# Patient Record
Sex: Male | Born: 1950 | Race: White | Hispanic: No | Marital: Married | Smoking: Never smoker
Health system: Southern US, Community
[De-identification: ages and names within clinical notes are randomized; demographics above are authoritative.]

## PROBLEM LIST (undated history)

## (undated) DIAGNOSIS — G4730 Sleep apnea, unspecified: Secondary | ICD-10-CM

## (undated) DIAGNOSIS — R7303 Prediabetes: Secondary | ICD-10-CM

## (undated) DIAGNOSIS — C439 Malignant melanoma of skin, unspecified: Secondary | ICD-10-CM

## (undated) DIAGNOSIS — B159 Hepatitis A without hepatic coma: Secondary | ICD-10-CM

## (undated) DIAGNOSIS — I10 Essential (primary) hypertension: Secondary | ICD-10-CM

## (undated) HISTORY — PX: HERNIA REPAIR: SHX51

## (undated) HISTORY — PX: CYSTOSCOPY W/ URETERAL STENT PLACEMENT: SHX1429

## (undated) HISTORY — PX: MELANOMA EXCISION: SHX5266

---

## 2015-04-18 HISTORY — PX: APPENDECTOMY: SHX54

## 2020-08-02 ENCOUNTER — Observation Stay (HOSPITAL_COMMUNITY): Payer: PRIVATE HEALTH INSURANCE

## 2020-08-02 ENCOUNTER — Emergency Department (HOSPITAL_COMMUNITY): Payer: PRIVATE HEALTH INSURANCE

## 2020-08-02 ENCOUNTER — Inpatient Hospital Stay (HOSPITAL_COMMUNITY)
Admission: EM | Admit: 2020-08-02 | Discharge: 2020-08-14 | DRG: 025 | Disposition: A | Discharge status: Home / Self Care | Payer: PRIVATE HEALTH INSURANCE | Attending: Neurosurgery | Admitting: Neurosurgery

## 2020-08-02 ENCOUNTER — Encounter (HOSPITAL_COMMUNITY): Payer: Self-pay | Admitting: Internal Medicine

## 2020-08-02 ENCOUNTER — Other Ambulatory Visit: Payer: Self-pay

## 2020-08-02 DIAGNOSIS — D496 Neoplasm of unspecified behavior of brain: Secondary | ICD-10-CM

## 2020-08-02 DIAGNOSIS — N1831 Chronic kidney disease, stage 3a: Secondary | ICD-10-CM | POA: Diagnosis present

## 2020-08-02 DIAGNOSIS — C4442 Squamous cell carcinoma of skin of scalp and neck: Secondary | ICD-10-CM | POA: Diagnosis present

## 2020-08-02 DIAGNOSIS — E669 Obesity, unspecified: Secondary | ICD-10-CM | POA: Diagnosis present

## 2020-08-02 DIAGNOSIS — I700 Atherosclerosis of aorta: Secondary | ICD-10-CM | POA: Diagnosis present

## 2020-08-02 DIAGNOSIS — Z6834 Body mass index (BMI) 34.0-34.9, adult: Secondary | ICD-10-CM

## 2020-08-02 DIAGNOSIS — R29810 Facial weakness: Secondary | ICD-10-CM | POA: Diagnosis present

## 2020-08-02 DIAGNOSIS — G9389 Other specified disorders of brain: Secondary | ICD-10-CM | POA: Diagnosis not present

## 2020-08-02 DIAGNOSIS — R4702 Dysphasia: Secondary | ICD-10-CM | POA: Diagnosis present

## 2020-08-02 DIAGNOSIS — B368 Other specified superficial mycoses: Secondary | ICD-10-CM | POA: Diagnosis present

## 2020-08-02 DIAGNOSIS — Z20822 Contact with and (suspected) exposure to covid-19: Secondary | ICD-10-CM | POA: Diagnosis present

## 2020-08-02 DIAGNOSIS — Z91013 Allergy to seafood: Secondary | ICD-10-CM

## 2020-08-02 DIAGNOSIS — G992 Myelopathy in diseases classified elsewhere: Secondary | ICD-10-CM | POA: Diagnosis present

## 2020-08-02 DIAGNOSIS — G936 Cerebral edema: Secondary | ICD-10-CM | POA: Diagnosis present

## 2020-08-02 DIAGNOSIS — G8191 Hemiplegia, unspecified affecting right dominant side: Secondary | ICD-10-CM | POA: Diagnosis present

## 2020-08-02 DIAGNOSIS — R471 Dysarthria and anarthria: Secondary | ICD-10-CM | POA: Diagnosis present

## 2020-08-02 DIAGNOSIS — C61 Malignant neoplasm of prostate: Secondary | ICD-10-CM | POA: Diagnosis present

## 2020-08-02 DIAGNOSIS — C713 Malignant neoplasm of parietal lobe: Principal | ICD-10-CM | POA: Diagnosis present

## 2020-08-02 DIAGNOSIS — R001 Bradycardia, unspecified: Secondary | ICD-10-CM | POA: Diagnosis not present

## 2020-08-02 DIAGNOSIS — L570 Actinic keratosis: Secondary | ICD-10-CM | POA: Diagnosis present

## 2020-08-02 DIAGNOSIS — R32 Unspecified urinary incontinence: Secondary | ICD-10-CM | POA: Diagnosis not present

## 2020-08-02 DIAGNOSIS — R4701 Aphasia: Secondary | ICD-10-CM | POA: Diagnosis not present

## 2020-08-02 DIAGNOSIS — C7931 Secondary malignant neoplasm of brain: Secondary | ICD-10-CM

## 2020-08-02 DIAGNOSIS — N4289 Other specified disorders of prostate: Secondary | ICD-10-CM

## 2020-08-02 DIAGNOSIS — R972 Elevated prostate specific antigen [PSA]: Secondary | ICD-10-CM

## 2020-08-02 DIAGNOSIS — Z880 Allergy status to penicillin: Secondary | ICD-10-CM

## 2020-08-02 DIAGNOSIS — G939 Disorder of brain, unspecified: Secondary | ICD-10-CM

## 2020-08-02 DIAGNOSIS — Z808 Family history of malignant neoplasm of other organs or systems: Secondary | ICD-10-CM

## 2020-08-02 DIAGNOSIS — I129 Hypertensive chronic kidney disease with stage 1 through stage 4 chronic kidney disease, or unspecified chronic kidney disease: Secondary | ICD-10-CM | POA: Diagnosis present

## 2020-08-02 DIAGNOSIS — I611 Nontraumatic intracerebral hemorrhage in hemisphere, cortical: Secondary | ICD-10-CM | POA: Diagnosis present

## 2020-08-02 DIAGNOSIS — M4802 Spinal stenosis, cervical region: Secondary | ICD-10-CM | POA: Diagnosis present

## 2020-08-02 DIAGNOSIS — Z85820 Personal history of malignant melanoma of skin: Secondary | ICD-10-CM

## 2020-08-02 DIAGNOSIS — G4733 Obstructive sleep apnea (adult) (pediatric): Secondary | ICD-10-CM | POA: Diagnosis present

## 2020-08-02 DIAGNOSIS — F40240 Claustrophobia: Secondary | ICD-10-CM | POA: Diagnosis present

## 2020-08-02 DIAGNOSIS — R159 Full incontinence of feces: Secondary | ICD-10-CM | POA: Diagnosis not present

## 2020-08-02 DIAGNOSIS — M48061 Spinal stenosis, lumbar region without neurogenic claudication: Secondary | ICD-10-CM | POA: Diagnosis present

## 2020-08-02 HISTORY — DX: Prediabetes: R73.03

## 2020-08-02 HISTORY — DX: Essential (primary) hypertension: I10

## 2020-08-02 HISTORY — DX: Sleep apnea, unspecified: G47.30

## 2020-08-02 HISTORY — DX: Malignant melanoma of skin, unspecified: C43.9

## 2020-08-02 HISTORY — DX: Hepatitis a without hepatic coma: B15.9

## 2020-08-02 LAB — DIFFERENTIAL
Abs Immature Granulocytes: 0.06 10*3/uL (ref 0.00–0.07)
Basophils Absolute: 0 10*3/uL (ref 0.0–0.1)
Basophils Relative: 0 %
Eosinophils Absolute: 0.2 10*3/uL (ref 0.0–0.5)
Eosinophils Relative: 3 %
Immature Granulocytes: 1 %
Lymphocytes Relative: 22 %
Lymphs Abs: 1.7 10*3/uL (ref 0.7–4.0)
Monocytes Absolute: 0.6 10*3/uL (ref 0.1–1.0)
Monocytes Relative: 8 %
Neutro Abs: 5 10*3/uL (ref 1.7–7.7)
Neutrophils Relative %: 66 %

## 2020-08-02 LAB — CBC
HCT: 48.7 % (ref 39.0–52.0)
Hemoglobin: 16 g/dL (ref 13.0–17.0)
MCH: 30.5 pg (ref 26.0–34.0)
MCHC: 32.9 g/dL (ref 30.0–36.0)
MCV: 92.9 fL (ref 80.0–100.0)
Platelets: 202 10*3/uL (ref 150–400)
RBC: 5.24 MIL/uL (ref 4.22–5.81)
RDW: 12.8 % (ref 11.5–15.5)
WBC: 7.5 10*3/uL (ref 4.0–10.5)
nRBC: 0 % (ref 0.0–0.2)

## 2020-08-02 LAB — COMPREHENSIVE METABOLIC PANEL
ALT: 20 U/L (ref 0–44)
AST: 20 U/L (ref 15–41)
Albumin: 4 g/dL (ref 3.5–5.0)
Alkaline Phosphatase: 34 U/L — ABNORMAL LOW (ref 38–126)
Anion gap: 9 (ref 5–15)
BUN: 22 mg/dL (ref 8–23)
CO2: 24 mmol/L (ref 22–32)
Calcium: 9.4 mg/dL (ref 8.9–10.3)
Chloride: 106 mmol/L (ref 98–111)
Creatinine, Ser: 1.68 mg/dL — ABNORMAL HIGH (ref 0.61–1.24)
GFR, Estimated: 44 mL/min — ABNORMAL LOW (ref >60)
Glucose, Bld: 129 mg/dL — ABNORMAL HIGH (ref 70–99)
Potassium: 3.9 mmol/L (ref 3.5–5.1)
Sodium: 139 mmol/L (ref 135–145)
Total Bilirubin: 0.9 mg/dL (ref 0.3–1.2)
Total Protein: 7 g/dL (ref 6.5–8.1)

## 2020-08-02 LAB — I-STAT CHEM 8, ED
BUN: 26 mg/dL — ABNORMAL HIGH (ref 8–23)
Calcium, Ion: 1.12 mmol/L — ABNORMAL LOW (ref 1.15–1.40)
Chloride: 107 mmol/L (ref 98–111)
Creatinine, Ser: 1.5 mg/dL — ABNORMAL HIGH (ref 0.61–1.24)
Glucose, Bld: 129 mg/dL — ABNORMAL HIGH (ref 70–99)
HCT: 49 % (ref 39.0–52.0)
Hemoglobin: 16.7 g/dL (ref 13.0–17.0)
Potassium: 3.9 mmol/L (ref 3.5–5.1)
Sodium: 142 mmol/L (ref 135–145)
TCO2: 24 mmol/L (ref 22–32)

## 2020-08-02 LAB — CBG MONITORING, ED: Glucose-Capillary: 134 mg/dL — ABNORMAL HIGH (ref 70–99)

## 2020-08-02 LAB — PROTIME-INR
INR: 1 (ref 0.8–1.2)
Prothrombin Time: 12.9 seconds (ref 11.4–15.2)

## 2020-08-02 LAB — RESP PANEL BY RT-PCR (FLU A&B, COVID) ARPGX2
Influenza A by PCR: NEGATIVE
Influenza B by PCR: NEGATIVE
SARS Coronavirus 2 by RT PCR: NEGATIVE

## 2020-08-02 LAB — APTT: aPTT: 25 seconds (ref 24–36)

## 2020-08-02 IMAGING — CT CT CHEST W/ CM
2 of 5 series · 13 of 36 positions shown, 16 images · IV contrast (omnipaque)
Comparison: None.

CLINICAL DATA: CNS neoplasm, staging evaluation

EXAM:
CT CHEST, ABDOMEN, AND PELVIS WITH CONTRAST
TECHNIQUE: Multidetector CT imaging of the chest, abdomen and pelvis was
performed following the standard protocol during bolus
administration of intravenous contrast.
CONTRAST:  70mL OMNIPAQUE IOHEXOL 300 MG/ML  SOLN

[Series 3: cap with 5mm st · axial · 0.98mm/px · z∈[+668,+1253]mm · 10 of 141 slices shown, 13 images]
[im 12/141  mediastinal]
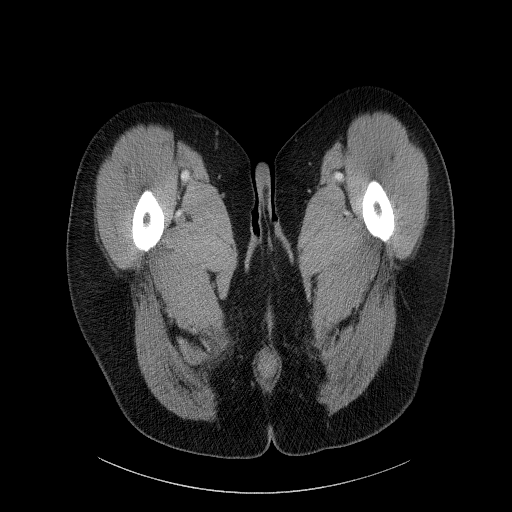
[im 12/141  lung]
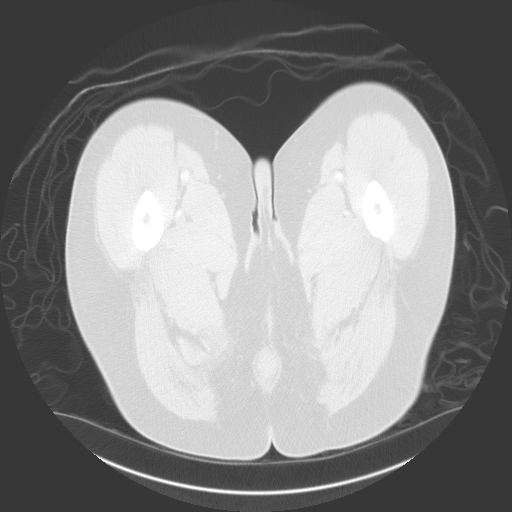
[im 24/141  lung]
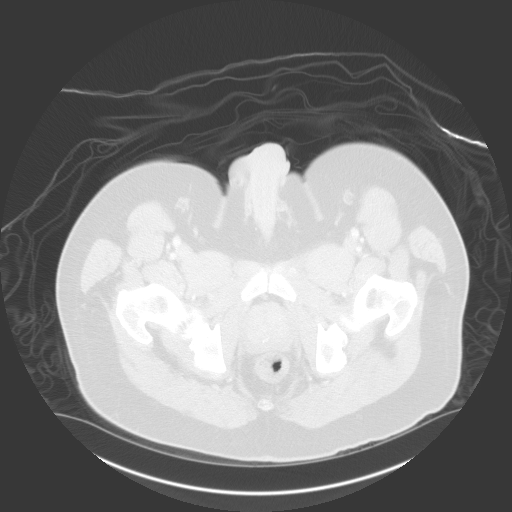
[im 36/141  lung]
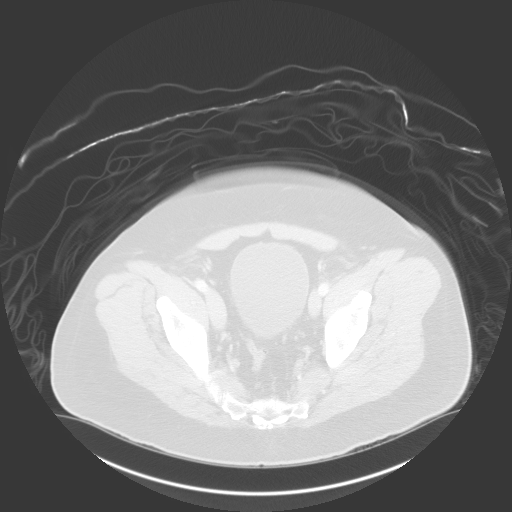
[im 47/141  lung]
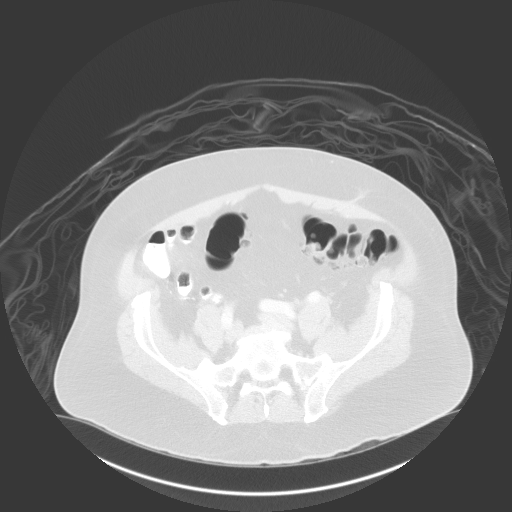
[im 59/141  mediastinal]
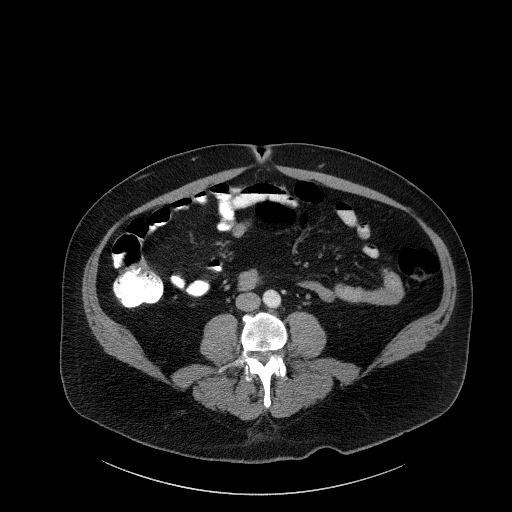
[im 59/141  lung]
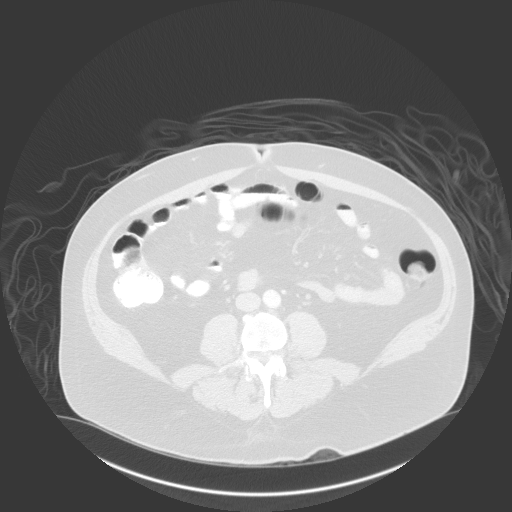
[im 82/141  lung]
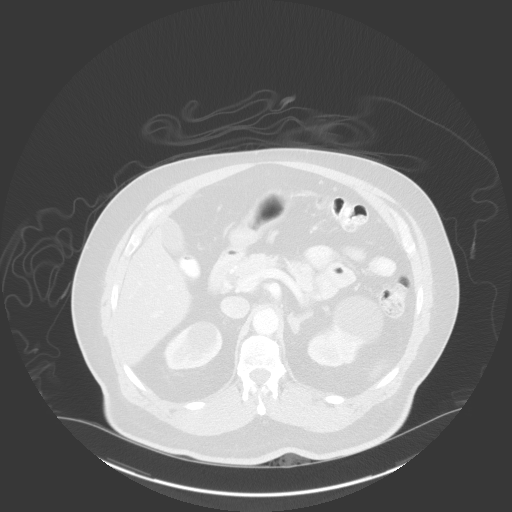
[im 94/141  lung]
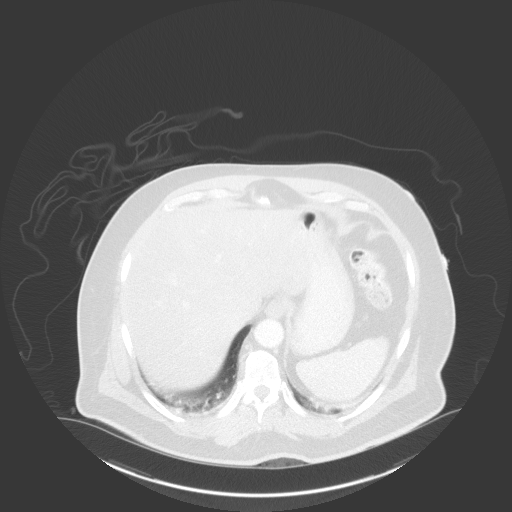
[im 106/141  lung]
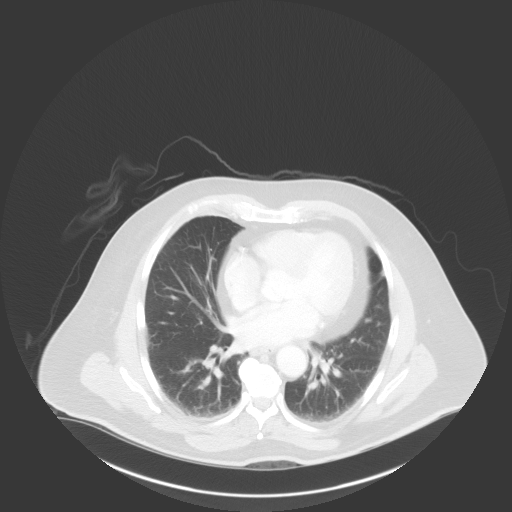
[im 117/141  mediastinal]
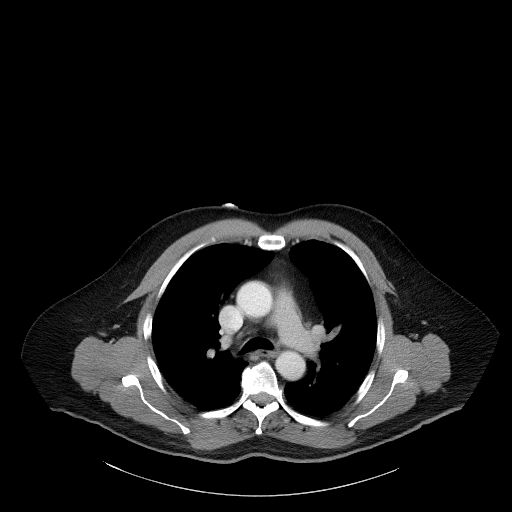
[im 117/141  lung]
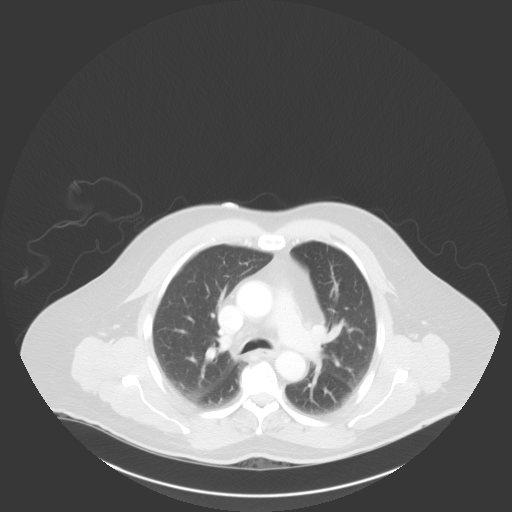
[im 129/141  lung]
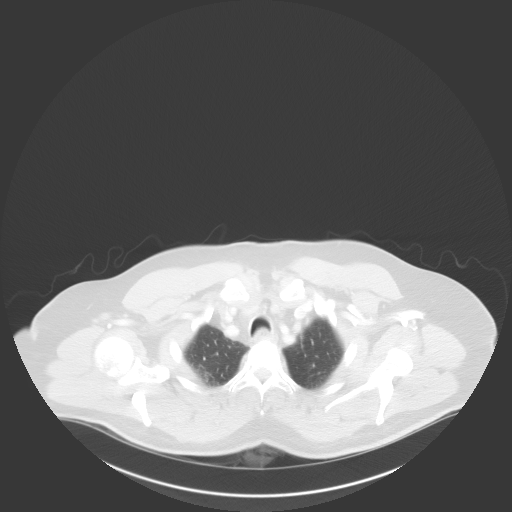

[Series 6: cap with 3mm st cor · coronal · 0.87mm/px · 3 of 174 slices shown]
[im 35/174  lung]
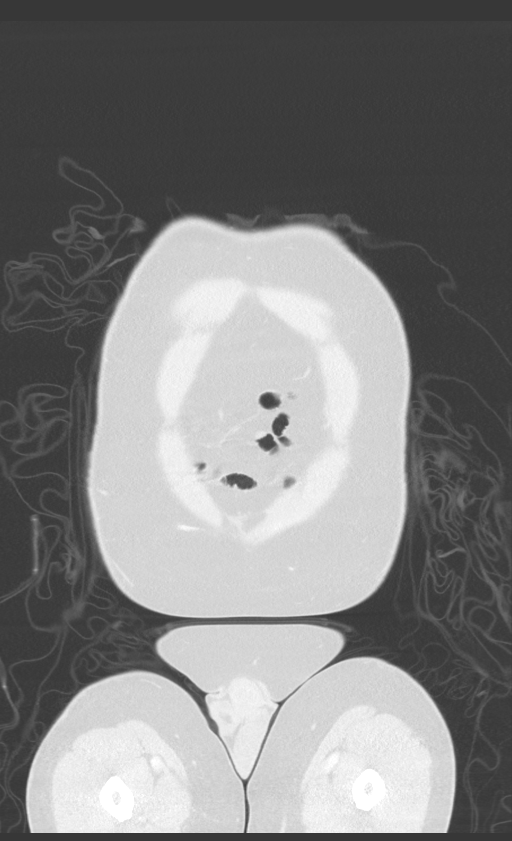
[im 70/174  lung]
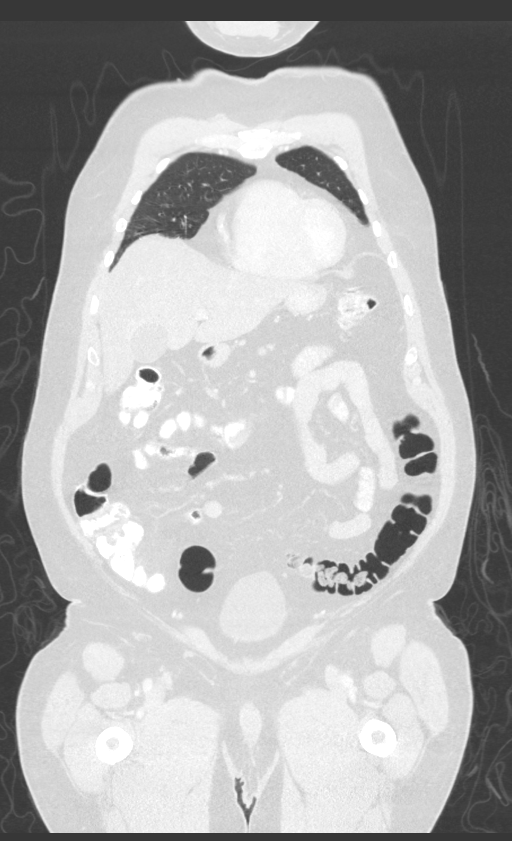
[im 104/174  lung]
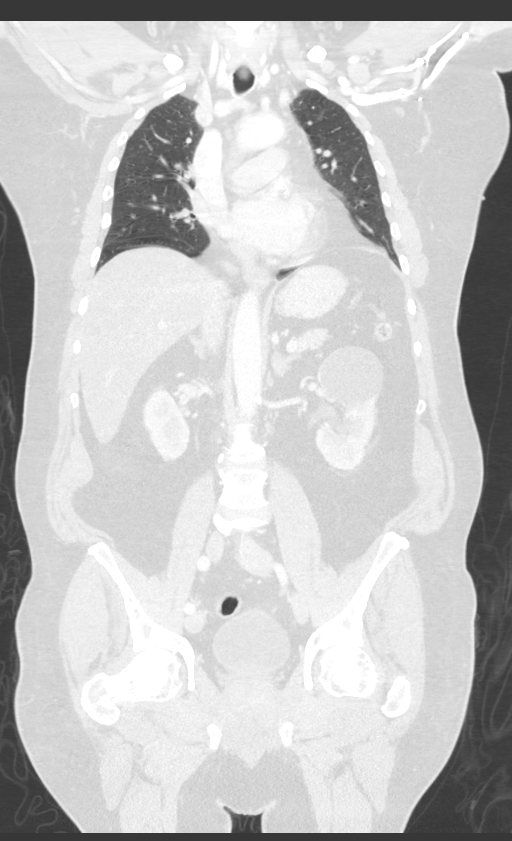

[13 of 36 positions shown; findings below may reference images not displayed]

FINDINGS: CT CHEST FINDINGS

Cardiovascular: The heart is normal in size. No pericardial
effusion.

No evidence of thoracic aortic aneurysm.

Coronary atherosclerosis of the LAD.

Mediastinum/Nodes: No suspicious mediastinal lymphadenopathy.

Visualized thyroid is unremarkable.

Lungs/Pleura: Mild dependent atelectasis in the bilateral upper and
lower lobes.

No focal consolidation.

No suspicious pulmonary nodules.

No pleural effusion or pneumothorax.

Musculoskeletal: Degenerative changes of the thoracic spine.

CT ABDOMEN PELVIS FINDINGS

Hepatobiliary: Liver is notable for hepatic steatosis with focal
fatty sparing along the gallbladder fossa.

Gallbladder is unremarkable. No intrahepatic or extrahepatic ductal
dilatation.

Pancreas: Within normal limits.

Spleen: Within normal limits, noting a splenule in the splenic hilum
(series 3/image 56).

Adrenals/Urinary Tract: Adrenal glands are within normal limits.

Bilateral renal cysts, measuring up to 5.5 cm in the anterior left
upper kidney (series 3/image 62). 2 mm nonobstructing left lower
pole renal calculus (series 3/image 69). No ureteral or bladder
calculi. No hydronephrosis.

Bladder is within normal limits.

Stomach/Bowel: Stomach is within normal limits.

No evidence of bowel obstruction.

Prior appendectomy.

Mild left colonic diverticulosis, without evidence of
diverticulitis.

Vascular/Lymphatic: No evidence of abdominal aortic aneurysm.

Mild atherosclerotic calcifications of the abdominal aorta.

No suspicious abdominopelvic lymphadenopathy.

Reproductive: Prostate is grossly unremarkable.

Other: No abdominopelvic ascites.

Tiny fat containing bilateral inguinal hernias (series 3/image 113).

Musculoskeletal: Degenerative changes of the lumbar spine.
IMPRESSION: No CT findings suspicious for primary malignancy in the chest,
abdomen, or pelvis.

## 2020-08-02 IMAGING — CT CT HEAD CODE STROKE
3 series · 14 of 47 positions shown, 16 images · non-contrast
Comparison: None.

CLINICAL DATA: Code stroke. Neuro deficit. Acute stroke suspected.
New onset of aphasia and left-sided facial droop beginning 2 hours
ago.

EXAM:
CT HEAD WITHOUT CONTRAST
TECHNIQUE: Contiguous axial images were obtained from the base of the skull
through the vertex without intravenous contrast.

[Series 2: head 5.0 h30s · axial · 0.47mm/px · z∈[-140,+5]mm · 8 of 35 slices shown, 10 images]
[im 3/35  brain]
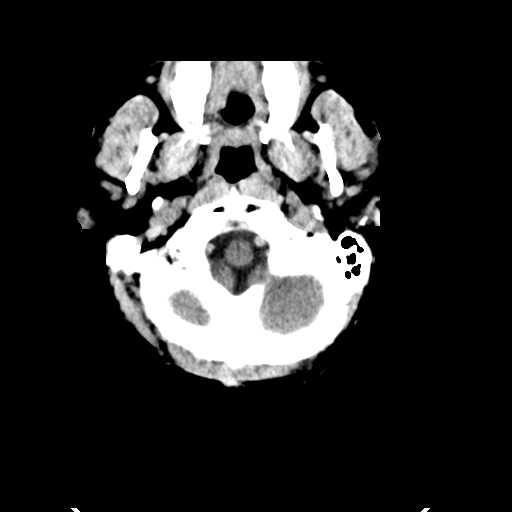
[im 3/35  bone]
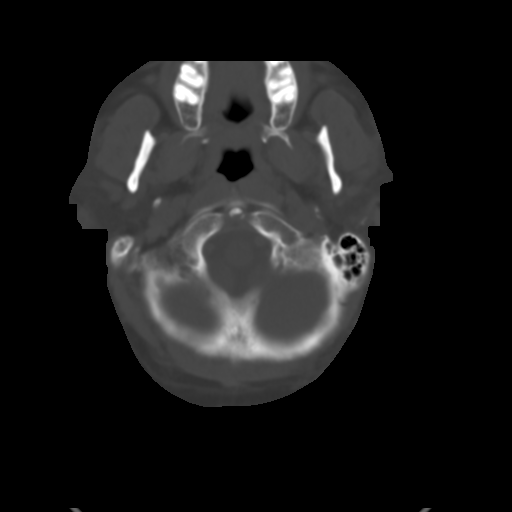
[im 8/35  brain]
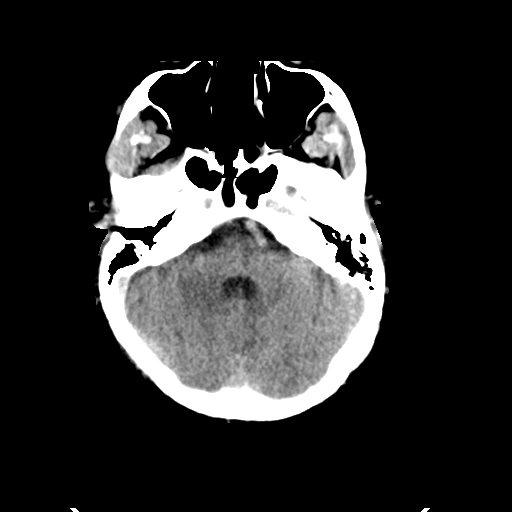
[im 11/35  brain]
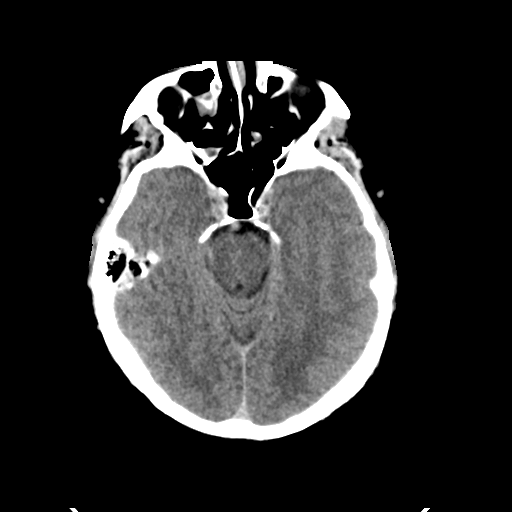
[im 16/35  brain]
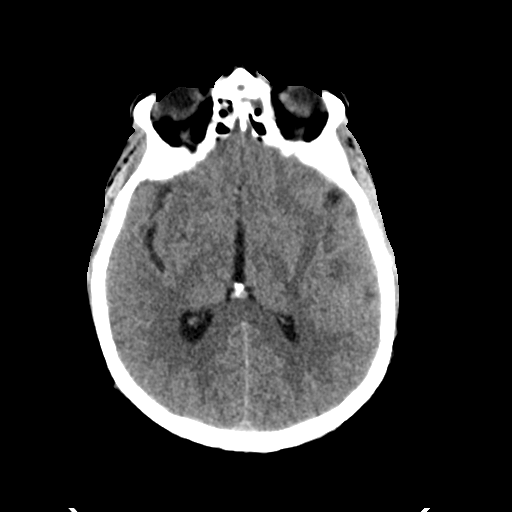
[im 19/35  brain]
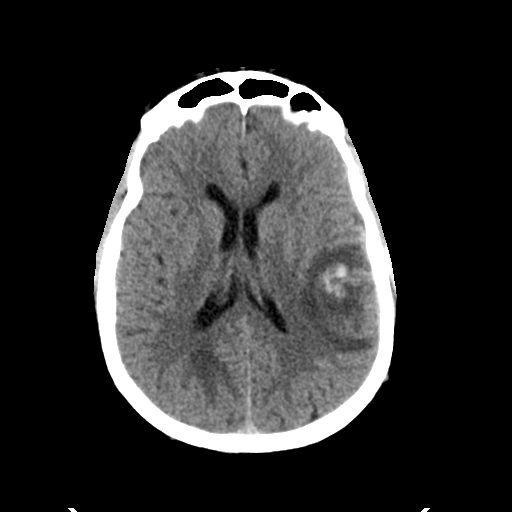
[im 19/35  bone]
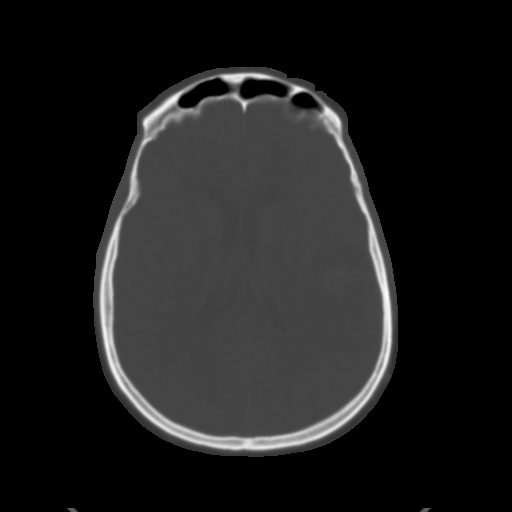
[im 24/35  brain]
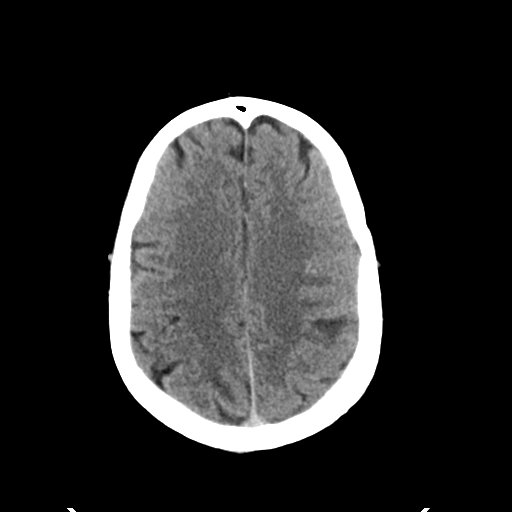
[im 27/35  brain]
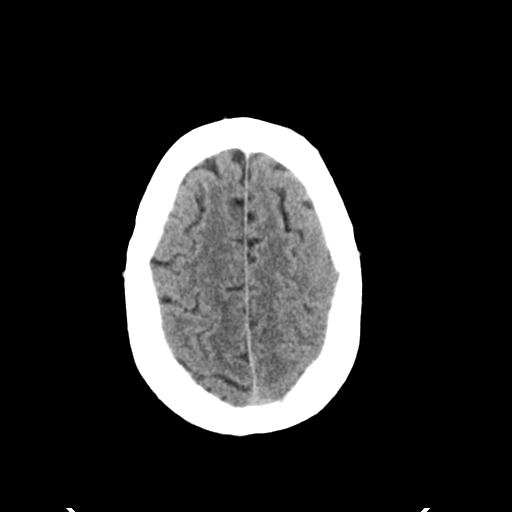
[im 32/35  brain]
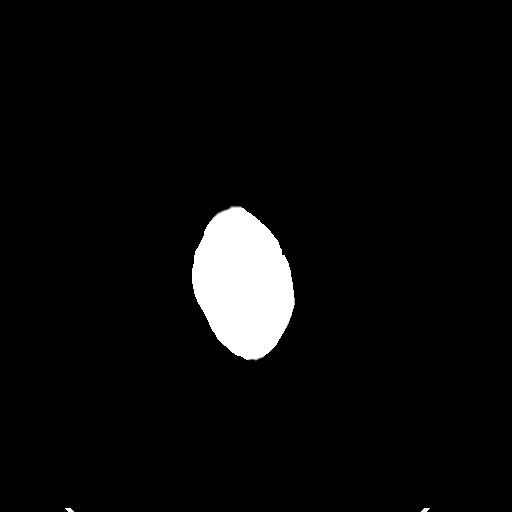

[Series 6: head 3.0 mpr cor · coronal · 0.35mm/px · 3 of 85 slices shown]
[im 29/85  brain]
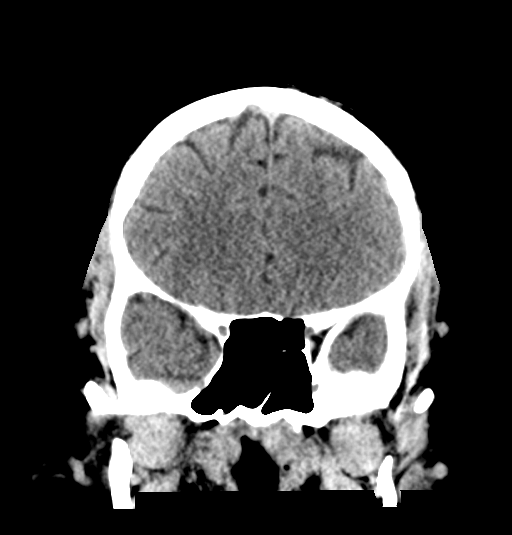
[im 38/85  brain]
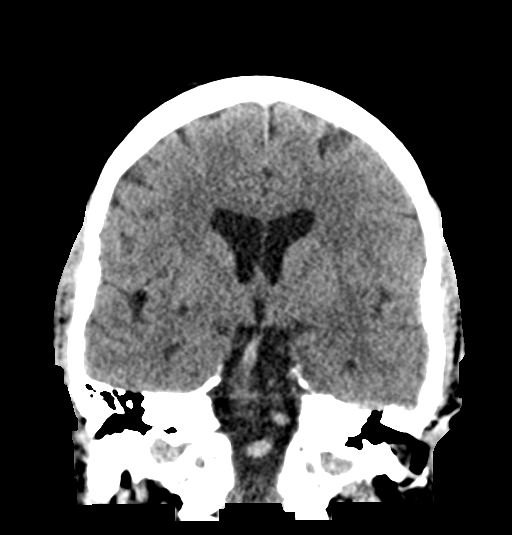
[im 47/85  brain]
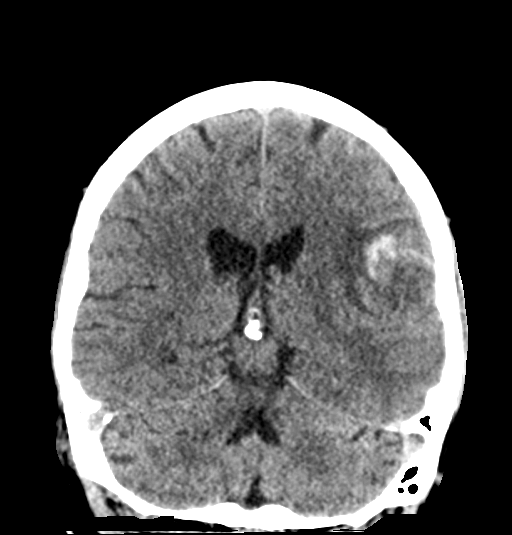

[Series 7: head 3.0 mpr sag · sagittal · 0.34mm/px · 3 of 67 slices shown]
[im 23/67  brain]
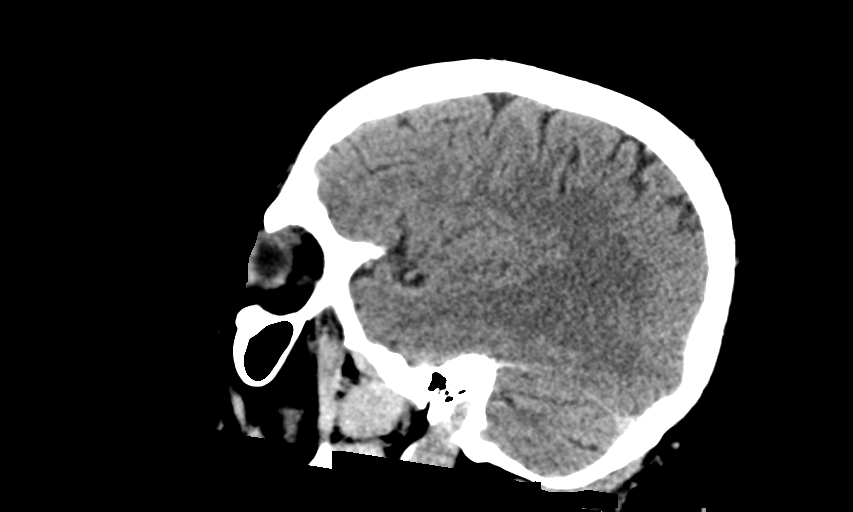
[im 34/67  brain]
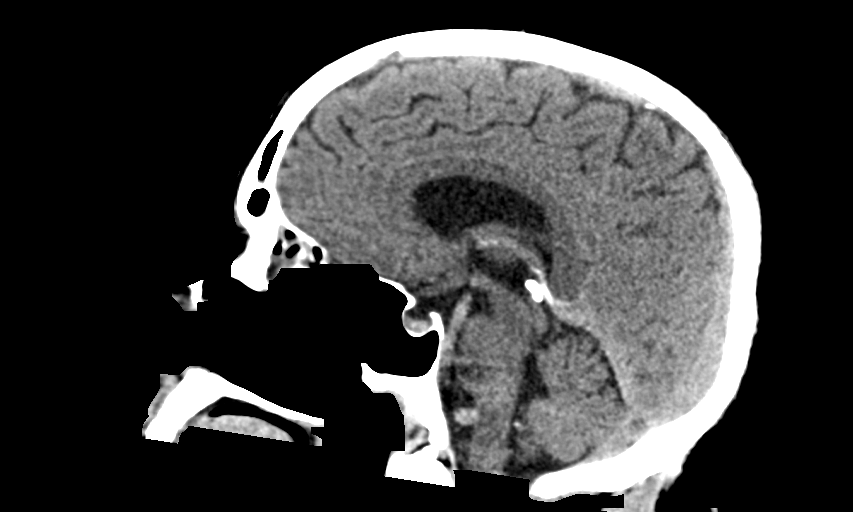
[im 45/67  brain]
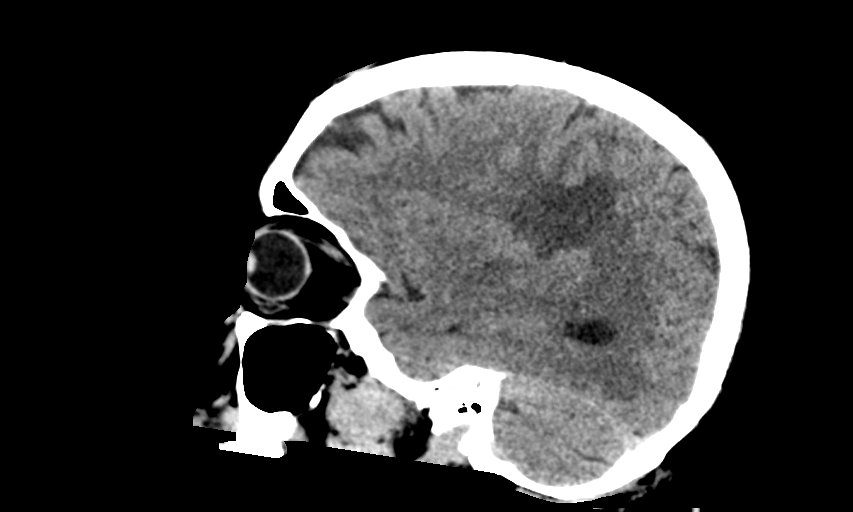

[14 of 47 positions shown; findings below may reference images not displayed]

FINDINGS: Brain: Mass lesion is present in the left posterior frontal and
parietal lobe measuring 3.4 x 2.6 by 2.8 cm. Hyperdense material
anteriorly is compatible with acute hemorrhage.

A second lesion is present in the posterior right parietal lobe
measuring up to 1.4 cm. Vasogenic edema surrounds both lesions.
There is some local mass effect in the posterior left frontal lobe
and parietal lobe with effacement of the sulci. No significant
midline shift is present.

The basal ganglia are intact. No acute or focal cortical
abnormalities are present. White matter is otherwise unremarkable.

Vascular: No hyperdense vessel or unexpected calcification.

Skull: Calvarium is intact. No focal lytic or blastic lesions are
present. No significant extracranial soft tissue lesion is present.

Sinuses/Orbits: Polyp or mucous retention cyst occludes the right
ostiomeatal complex. Scattered opacification of ethmoid air cells is
noted bilaterally. The globes and orbits are within normal limits.
IMPRESSION: 1. At least 2 mass lesions identified. The largest is in the
posterior left frontal lobe and parietal lobe. Acute hemorrhage is
noted in the anterior aspect of this lesion.
2. Second lesion identified within the right parietal lobe.
3. These likely reflect metastatic lesions.
4. No additional acute hemorrhage or focal cortical infarct.

Critical Value/emergent results were called by telephone at the time
of interpretation on [DATE] at [DATE] to provider Dr. MATTESS,
who verbally acknowledged these results.

## 2020-08-02 MED ORDER — LABETALOL HCL 5 MG/ML IV SOLN
INTRAVENOUS | Status: AC | PRN
  Administered 2020-08-02 (×2): 5 mg via INTRAVENOUS

## 2020-08-02 MED ORDER — ONDANSETRON HCL 4 MG/2ML IJ SOLN: 4.0000 mg | Freq: Four times a day (QID) | INTRAMUSCULAR | Status: DC | PRN

## 2020-08-02 MED ORDER — ADULT MULTIVITAMIN W/MINERALS CH
1.0000 | ORAL_TABLET | Freq: Every day | ORAL | Status: DC
  Administered 2020-08-03 – 2020-08-10 (×7): 1 via ORAL
  Filled 2020-08-02 (×7): qty 1

## 2020-08-02 MED ORDER — SODIUM CHLORIDE 0.9% FLUSH
3.0000 mL | Freq: Two times a day (BID) | INTRAVENOUS | Status: DC
  Administered 2020-08-02 – 2020-08-10 (×16): 3 mL via INTRAVENOUS

## 2020-08-02 MED ORDER — DEXAMETHASONE SODIUM PHOSPHATE 10 MG/ML IJ SOLN
6.0000 mg | Freq: Four times a day (QID) | INTRAMUSCULAR | Status: AC
  Administered 2020-08-02 – 2020-08-03 (×4): 6 mg via INTRAVENOUS
  Filled 2020-08-02 (×3): qty 0.6
  Filled 2020-08-02: qty 1

## 2020-08-02 MED ORDER — LEVETIRACETAM 500 MG PO TABS
500.0000 mg | ORAL_TABLET | Freq: Two times a day (BID) | ORAL | Status: AC
  Administered 2020-08-02 – 2020-08-08 (×13): 500 mg via ORAL
  Filled 2020-08-02 (×14): qty 1

## 2020-08-02 MED ORDER — ONDANSETRON HCL 4 MG PO TABS: 4.0000 mg | ORAL_TABLET | Freq: Four times a day (QID) | ORAL | Status: DC | PRN

## 2020-08-02 MED ORDER — IOHEXOL 300 MG/ML  SOLN
70.0000 mL | Freq: Once | INTRAMUSCULAR | Status: AC | PRN
  Administered 2020-08-02: 70 mL via INTRAVENOUS

## 2020-08-02 MED ORDER — ACETAMINOPHEN 650 MG RE SUPP: 650.0000 mg | Freq: Four times a day (QID) | RECTAL | Status: DC | PRN

## 2020-08-02 MED ORDER — DEXAMETHASONE SODIUM PHOSPHATE 4 MG/ML IJ SOLN
4.0000 mg | Freq: Four times a day (QID) | INTRAMUSCULAR | Status: AC
  Administered 2020-08-03 – 2020-08-04 (×3): 4 mg via INTRAVENOUS
  Filled 2020-08-02 (×4): qty 1

## 2020-08-02 MED ORDER — POLYETHYLENE GLYCOL 3350 17 G PO PACK: 17.0000 g | PACK | Freq: Every day | ORAL | Status: DC | PRN

## 2020-08-02 MED ORDER — SODIUM CHLORIDE 0.9% FLUSH
3.0000 mL | Freq: Once | INTRAVENOUS | Status: AC
  Administered 2020-08-02: 3 mL via INTRAVENOUS

## 2020-08-02 MED ORDER — LORAZEPAM 2 MG/ML IJ SOLN
2.0000 mg | Freq: Once | INTRAMUSCULAR | Status: AC
  Administered 2020-08-02: 2 mg via INTRAVENOUS
  Filled 2020-08-02: qty 1

## 2020-08-02 MED ORDER — ACETAMINOPHEN 325 MG PO TABS
650.0000 mg | ORAL_TABLET | Freq: Four times a day (QID) | ORAL | Status: DC | PRN
  Filled 2020-08-02: qty 2

## 2020-08-02 NOTE — Code Documentation (Signed)
Stroke Response Nurse Documentation Code Documentation  Kaelon Weekes is a 70 y.o. male arriving to [facility] ED via Private Vehicle on 08/02/2020 with past medical hx of borderline hypertension, not on antihypertensives. Code stroke was activated by ED. Patient has been traveling via bus from [place] to [place] where he was LKW at 1000 and now complaining of slurred speech and difficulty finding his words. On No antithrombotic. Stroke team at the bedside on patient arrival. Labs drawn and patient cleared for CT by Dr. Posey Pronto. Patient to CT with team. NIHSS 1, see documentation for details and code stroke times. Patient with Expressive aphasia  on exam. The following imaging was completed: CT. Patient is not a candidate for tPA due to CT findings.   Care/Plan: q1h NIHSS, VS, pupils- SBP< 140 Bedside handoff with ED RN    [place] RN

## 2020-08-02 NOTE — Progress Notes (Signed)
MD returned call and MRI called to see if imaging can be done tonight

## 2020-08-02 NOTE — Progress Notes (Signed)
Pt's saturations noted to be around 87-89% while sleeping soundly. Pt's husband says that pt may have been previously diagnosed with OSA but that they have not followed up with treatment at home. Pt placed on 2L [place] and sats are now 92%.   Justice Rocher, RN

## 2020-08-02 NOTE — Progress Notes (Signed)
Pt unable to stay still during exam, unable to perform pre-neurosurgical MRI unless pt can be very still. NP aware, possibly TBD later.

## 2020-08-02 NOTE — Hospital Course (Signed)
   Hx of HTN, managed without medication. Pre-diabetes, 6.0%, no medication

## 2020-08-02 NOTE — Progress Notes (Signed)
Pt arrived from 4NP14 from ED. Vitals signs taken and WNL, CHG bath given, and assessment completed and documented. Pt belongings at bedside including clothing, shoes, Epipen, and books. Pt's husband, [name], at bedside shortly after pt transfer and agreed to keep Epipen. His contact information is now in the chart.   Justice Rocher, RN

## 2020-08-02 NOTE — ED Triage Notes (Signed)
Pt just arrived on bus from [place] this morning.  Partner reports aphasia and L sided facial droop that started at 10am while eating at [employer].  No arm drift.

## 2020-08-02 NOTE — ED Provider Notes (Signed)
[facility] EMERGENCY DEPARTMENT Provider Note   CSN: [id number] Arrival date & time: 08/02/20  1153  An emergency department physician performed an initial assessment on this suspected stroke patient at 1206.  History Chief Complaint  Patient presents with  . Code Stroke    Bryan Hobbs is a 70 y.o. male with no read about medical history that presents the emerge department this morning from [place] for aphasia.  Patient states that he was with his partner eating at [facility], partner started noticing sudden onset of of difficulty word finding.  Code stroke called, no other deficits noted.  Dr. Curly Shores, neurology at bedside.  Patient states that he had a tooth canal surgery done about 3 weeks ago, states that he thinks he could have had difficulty speaking since then, however contributed to the dental surgery however today was slightly different.  States that he all of a sudden started having difficulty finding words.  Denies any vision changes, gait abnormality, headache.  No head trauma.  Is not any blood thinners.  HPI     No past medical history on file.  There are no problems to display for this patient.      No family history on file.     Home Medications Prior to Admission medications   Not on File    Allergies    Patient has no allergy information on record.  Review of Systems   Review of Systems  Unable to perform ROS: Acuity of condition    Physical Exam Updated Vital Signs BP 117/82   Pulse 67   Temp 98.4 F (36.9 C) (Oral)   Resp (!) 21   Ht 5\' 7"  (1.702 m)   Wt 99.2 kg   SpO2 96%   BMI 34.25 kg/m   Physical Exam Constitutional:      General: He is not in acute distress.    Appearance: Normal appearance. He is not ill-appearing, toxic-appearing or diaphoretic.  HENT:     Mouth/Throat:     Mouth: Mucous membranes are moist.     Pharynx: Oropharynx is clear.  Eyes:     General: No scleral icterus.    Extraocular  Movements: Extraocular movements intact.     Pupils: Pupils are equal, round, and reactive to light.  Cardiovascular:     Rate and Rhythm: Normal rate and regular rhythm.     Pulses: Normal pulses.     Heart sounds: Normal heart sounds.  Pulmonary:     Effort: Pulmonary effort is normal. No respiratory distress.     Breath sounds: Normal breath sounds. No stridor. No wheezing, rhonchi or rales.  Chest:     Chest wall: No tenderness.  Abdominal:     General: Abdomen is flat. There is no distension.     Palpations: Abdomen is soft.     Tenderness: There is no abdominal tenderness. There is no guarding or rebound.  Musculoskeletal:        General: No swelling or tenderness. Normal range of motion.     Cervical back: Normal range of motion and neck supple. No rigidity.     Right lower leg: No edema.     Left lower leg: No edema.  Skin:    General: Skin is warm and dry.     Capillary Refill: Capillary refill takes less than 2 seconds.     Coloration: Skin is not pale.  Neurological:     General: No focal deficit present.     Mental  Status: He is alert and oriented to person, place, and time.     Comments: Alert.  Clear aphasia, does have difficulty finding words and will often stabilize remember what I was going to say.  no facial droop. CNIII-XII grossly intact. Bilateral upper and lower extremities' sensation grossly intact. 5/5 symmetric strength with grip strength and with plantar and dorsi flexion bilaterally. Patellar DTRs are 2+ and symmetric . Normal finger to nose bilaterally. Negative pronator drift.   Psychiatric:        Mood and Affect: Mood normal.        Behavior: Behavior normal.     ED Results / Procedures / Treatments   Labs (all labs ordered are listed, but only abnormal results are displayed) Labs Reviewed  COMPREHENSIVE METABOLIC PANEL - Abnormal; Notable for the following components:      Result Value   Glucose, Bld 129 (*)    Creatinine, Ser 1.68 (*)     Alkaline Phosphatase 34 (*)    GFR, Estimated 44 (*)    All other components within normal limits  I-STAT CHEM 8, ED - Abnormal; Notable for the following components:   BUN 26 (*)    Creatinine, Ser 1.50 (*)    Glucose, Bld 129 (*)    Calcium, Ion 1.12 (*)    All other components within normal limits  CBG MONITORING, ED - Abnormal; Notable for the following components:   Glucose-Capillary 134 (*)    All other components within normal limits  RESP PANEL BY RT-PCR (FLU A&B, COVID) ARPGX2  PROTIME-INR  APTT  CBC  DIFFERENTIAL    EKG EKG Interpretation  Date/Time:  Monday August 02 2020 12:00:17 EDT Ventricular Rate:  80 PR Interval:  154 QRS Duration: 86 QT Interval:  364 QTC Calculation: 419 R Axis:   -3 Text Interpretation: Normal sinus rhythm Normal ECG No previous ECGs available Confirmed by Theotis Burrow [phone]) on 08/02/2020 12:28:36 PM   Radiology CT HEAD CODE STROKE WO CONTRAST  Result Date: 08/02/2020 CLINICAL DATA:  Code stroke. Neuro deficit. Acute stroke suspected. New onset of aphasia and left-sided facial droop beginning 2 hours ago. EXAM: CT HEAD WITHOUT CONTRAST TECHNIQUE: Contiguous axial images were obtained from the base of the skull through the vertex without intravenous contrast. COMPARISON:  None. FINDINGS: Brain: Mass lesion is present in the left posterior frontal and parietal lobe measuring 3.4 x 2.6 by 2.8 cm. Hyperdense material anteriorly is compatible with acute hemorrhage. A second lesion is present in the posterior right parietal lobe measuring up to 1.4 cm. Vasogenic edema surrounds both lesions. There is some local mass effect in the posterior left frontal lobe and parietal lobe with effacement of the sulci. No significant midline shift is present. The basal ganglia are intact. No acute or focal cortical abnormalities are present. White matter is otherwise unremarkable. Vascular: No hyperdense vessel or unexpected calcification. Skull: Calvarium is  intact. No focal lytic or blastic lesions are present. No significant extracranial soft tissue lesion is present. Sinuses/Orbits: Polyp or mucous retention cyst occludes the right ostiomeatal complex. Scattered opacification of ethmoid air cells is noted bilaterally. The globes and orbits are within normal limits. IMPRESSION: 1. At least 2 mass lesions identified. The largest is in the posterior left frontal lobe and parietal lobe. Acute hemorrhage is noted in the anterior aspect of this lesion. 2. Second lesion identified within the right parietal lobe. 3. These likely reflect metastatic lesions. 4. No additional acute hemorrhage or focal cortical infarct. Critical Value/emergent  results were called by telephone at the time of interpretation on 08/02/2020 at 12:25 pm to provider Dr. Curly Shores, who verbally acknowledged these results. Electronically Signed   By: San Morelle M.D.   On: 08/02/2020 12:29    Procedures Procedures   Medications Ordered in ED Medications  LORazepam (ATIVAN) injection 2 mg (has no administration in time range)  levETIRAcetam (KEPPRA) tablet 500 mg (500 mg Oral Given 08/02/20 1310)  labetalol (NORMODYNE) injection (5 mg Intravenous Given 08/02/20 1259)  dexamethasone (DECADRON) injection 6 mg (has no administration in time range)  dexamethasone (DECADRON) injection 4 mg (has no administration in time range)  sodium chloride flush (NS) 0.9 % injection 3 mL (3 mLs Intravenous Given 08/02/20 1227)    ED Course  I have reviewed the triage vital signs and the nursing notes.  Pertinent labs & imaging results that were available during my care of the patient were reviewed by me and considered in my medical decision making (see chart for details).    MDM Rules/Calculators/A&P                          Bryan Hobbs is a 70 y.o. male with no read about medical history that presents the emerge department this morning from [place] for aphasia.  Patient does have clear  aphasia, slightly worsening as exam proceeds.  No other focal neurodeficits.  Patient is stable.  CT head does show some mets  concerning for metastatic disease.  Apparently patient does have history of melanoma.  States he had also with some hemorrhagic conversion.  Spoke to neurology NP, Doberman who requests Ativan and Keppra at this time.  Also requesting CT abdomen chest and MRI of brain.  225 spoke to Dr. Reinaldo Meeker, neurosurgery who will come evaluate patient.  Asking to hold on steroids until she comes to check patient out.  Patient will be admitted to medicine.  Upon repeat evaluation, patient is stable.  Awaiting MRI.  235Patient has been admitted to internal medicine, Dr. Charleen Kirks. .  The patient appears reasonably stabilized for admission considering the current resources, flow, and capabilities available in the ED at this time, and I doubt any other [facility] requiring further screening and/or treatment in the ED prior to admission.  I discussed this case with my attending physician who cosigned this note including patient's presenting symptoms, physical exam, and planned diagnostics and interventions. Attending physician stated agreement with plan or made changes to plan which were implemented.   Attending physician assessed patient at bedside.     Final Clinical Impression(s) / ED Diagnoses Final diagnoses:  Aphasia  Brain lesion    Rx / DC Orders ED Discharge Orders    None       Alfredia Client, PA-C 08/02/20 [place], Wenda Overland, MD 08/03/20 [phone]

## 2020-08-02 NOTE — ED Notes (Signed)
Patient transported to MRI 

## 2020-08-02 NOTE — Consult Note (Signed)
Providing Compassionate, Quality Care - Together   Reason for Consult: Brain lesion Referring Physician: Alfredia Client, PA-C  Bryan Hobbs is an 70 y.o. male.  HPI: Bryan Hobbs is a 70 y.o. Caucasian left-handed male, with a history significant for melanoma and a recently elevated PSA. He is followed by a dermatologist and, aside from excision with clean margins, no further treatment was required for the three melanoma sites. He and his husband just arrived in [place] for vacation from [place]. They were planning to be in [place] for a week. The patient presented to the [facility] emergency department this morning with expressive aphasia and left-sided facial weakness. He thinks his symptoms may have actually started approximately three weeks ago, but were less severe. He was attributing the changes in his speech to the dental work recently completed on the left side. He denies changes in vision, new weakness, seizure activity, nausea, vomiting, or change in appetite. He denies chest pain or shortness of breath. CT head revealed two mass lesions. Neurosurgery was consulted for further evaluation and recommendations.  Past Medical History:  Diagnosis Date  . Hypertension   . Melanoma ([place])   . Pre-diabetes      No family history on file.  Social History:  has no history on file for tobacco use, alcohol use, and drug use.  Allergies:  Allergies  Allergen Reactions  . Peanut-Containing Drug Products Swelling  . Shellfish Allergy Swelling  . Penicillins     He was told by a parent that he had an allergy as a child    Medications: I have reviewed the patient's current medications.  Results for orders placed or performed during the hospital encounter of 08/02/20 (from the past 48 hour(s))  Protime-INR     Status: None   Collection Time: 08/02/20 12:02 PM  Result Value Ref Range   Prothrombin Time 12.9 11.4 - 15.2 seconds   INR 1.0 0.8 - 1.2    Comment: (NOTE) INR goal  varies based on device and disease states. Performed at [facility], [place] [address] [place], [place] [address]   APTT     Status: None   Collection Time: 08/02/20 12:02 PM  Result Value Ref Range   aPTT 25 24 - 36 seconds    Comment: Performed at [facility] [address] [place], [place] [address]  CBC     Status: None   Collection Time: 08/02/20 12:02 PM  Result Value Ref Range   WBC 7.5 4.0 - 10.5 K/uL   RBC 5.24 4.22 - 5.81 MIL/uL   Hemoglobin 16.0 13.0 - 17.0 g/dL   HCT 48.7 39.0 - 52.0 %   MCV 92.9 80.0 - 100.0 fL   MCH 30.5 26.0 - 34.0 pg   MCHC 32.9 30.0 - 36.0 g/dL   RDW 12.8 11.5 - 15.5 %   Platelets 202 150 - 400 K/uL   nRBC 0.0 0.0 - 0.2 %    Comment: Performed at [facility], [place] [address] [place], [place] [address]  Differential     Status: None   Collection Time: 08/02/20 12:02 PM  Result Value Ref Range   Neutrophils Relative % 66 %   Neutro Abs 5.0 1.7 - 7.7 K/uL   Lymphocytes Relative 22 %   Lymphs Abs 1.7 0.7 - 4.0 K/uL   Monocytes Relative 8 %   Monocytes Absolute 0.6 0.1 - 1.0 K/uL   Eosinophils Relative 3 %   Eosinophils  Absolute 0.2 0.0 - 0.5 K/uL   Basophils Relative 0 %   Basophils Absolute 0.0 0.0 - 0.1 K/uL   Immature Granulocytes 1 %   Abs Immature Granulocytes 0.06 0.00 - 0.07 K/uL    Comment: Performed at [facility] [address], [place], [place] [address]  Comprehensive metabolic panel     Status: Abnormal   Collection Time: 08/02/20 12:02 PM  Result Value Ref Range   Sodium 139 135 - 145 mmol/L   Potassium 3.9 3.5 - 5.1 mmol/L   Chloride 106 98 - 111 mmol/L   CO2 24 22 - 32 mmol/L   Glucose, Bld 129 (H) 70 - 99 mg/dL    Comment: Glucose reference range applies only to samples taken after fasting for at least 8 hours.   BUN 22 8 - 23 mg/dL   Creatinine, Ser 1.68 (H) 0.61 - 1.24 mg/dL   Calcium 9.4 8.9 - 10.3 mg/dL   Total Protein 7.0 6.5 - 8.1 g/dL   Albumin 4.0 3.5 - 5.0 g/dL   AST 20 15 - 41  U/L   ALT 20 0 - 44 U/L   Alkaline Phosphatase 34 (L) 38 - 126 U/L   Total Bilirubin 0.9 0.3 - 1.2 mg/dL   GFR, Estimated 44 (L) >60 mL/min    Comment: (NOTE) Calculated using the CKD-EPI Creatinine Equation (2021)    Anion gap 9 5 - 15    Comment: Performed at [facility] [address], [place], [place] [address]  CBG monitoring, ED     Status: Abnormal   Collection Time: 08/02/20 12:05 PM  Result Value Ref Range   Glucose-Capillary 134 (H) 70 - 99 mg/dL    Comment: Glucose reference range applies only to samples taken after fasting for at least 8 hours.  I-stat chem 8, ED     Status: Abnormal   Collection Time: 08/02/20 12:12 PM  Result Value Ref Range   Sodium 142 135 - 145 mmol/L   Potassium 3.9 3.5 - 5.1 mmol/L   Chloride 107 98 - 111 mmol/L   BUN 26 (H) 8 - 23 mg/dL   Creatinine, Ser 1.50 (H) 0.61 - 1.24 mg/dL   Glucose, Bld 129 (H) 70 - 99 mg/dL    Comment: Glucose reference range applies only to samples taken after fasting for at least 8 hours.   Calcium, Ion 1.12 (L) 1.15 - 1.40 mmol/L   TCO2 24 22 - 32 mmol/L   Hemoglobin 16.7 13.0 - 17.0 g/dL   HCT 49.0 39.0 - 52.0 %    CT HEAD CODE STROKE WO CONTRAST  Result Date: 08/02/2020 CLINICAL DATA:  Code stroke. Neuro deficit. Acute stroke suspected. New onset of aphasia and left-sided facial droop beginning 2 hours ago. EXAM: CT HEAD WITHOUT CONTRAST TECHNIQUE: Contiguous axial images were obtained from the base of the skull through the vertex without intravenous contrast. COMPARISON:  None. FINDINGS: Brain: Mass lesion is present in the left posterior frontal and parietal lobe measuring 3.4 x 2.6 by 2.8 cm. Hyperdense material anteriorly is compatible with acute hemorrhage. A second lesion is present in the posterior right parietal lobe measuring up to 1.4 cm. Vasogenic edema surrounds both lesions. There is some local mass effect in the posterior left frontal lobe and parietal lobe with effacement of the sulci. No  significant midline shift is present. The basal ganglia are intact. No acute or focal cortical abnormalities are present. White matter is otherwise unremarkable. Vascular: No hyperdense  vessel or unexpected calcification. Skull: Calvarium is intact. No focal lytic or blastic lesions are present. No significant extracranial soft tissue lesion is present. Sinuses/Orbits: Polyp or mucous retention cyst occludes the right ostiomeatal complex. Scattered opacification of ethmoid air cells is noted bilaterally. The globes and orbits are within normal limits. IMPRESSION: 1. At least 2 mass lesions identified. The largest is in the posterior left frontal lobe and parietal lobe. Acute hemorrhage is noted in the anterior aspect of this lesion. 2. Second lesion identified within the right parietal lobe. 3. These likely reflect metastatic lesions. 4. No additional acute hemorrhage or focal cortical infarct. Critical Value/emergent results were called by telephone at the time of interpretation on 08/02/2020 at 12:25 pm to provider Dr. Curly Shores, who verbally acknowledged these results. Electronically Signed   By: San Morelle M.D.   On: 08/02/2020 12:29    Review of Systems  Constitutional: Negative for chills, diaphoresis, fever, malaise/fatigue and weight loss.  HENT: Negative for congestion, ear pain, hearing loss, sinus pain and sore throat.   Eyes: Negative for blurred vision, double vision and photophobia.  Respiratory: Negative for cough, sputum production, shortness of breath and wheezing.   Cardiovascular: Negative for chest pain, palpitations, orthopnea, claudication and leg swelling.  Gastrointestinal: Negative for abdominal pain, constipation, diarrhea, nausea and vomiting.  Genitourinary: Positive for frequency. Negative for dysuria, flank pain and hematuria.  Musculoskeletal: Negative for back pain, falls, joint pain, myalgias and neck pain.  Skin: Negative.   Neurological: Positive for sensory  change and speech change. Negative for focal weakness, seizures, loss of consciousness and headaches.  Endo/Heme/Allergies: Negative.   Psychiatric/Behavioral: Negative.    Blood pressure 117/82, pulse 67, temperature 98.4 F (36.9 C), temperature source Oral, resp. rate (!) 21, height 5\' 7"  (1.702 m), weight 99.2 kg, SpO2 96 %. Estimated body mass index is 34.25 kg/m as calculated from the following:   Height as of this encounter: 5\' 7"  (1.702 m).   Weight as of this encounter: 99.2 kg.  Physical Exam Constitutional:      Appearance: He is obese.  HENT:     Head: Normocephalic and atraumatic.     Nose: No congestion.     Mouth/Throat:     Mouth: Mucous membranes are moist.     Pharynx: Oropharynx is clear.  Eyes:     Extraocular Movements: Extraocular movements intact.     Conjunctiva/sclera: Conjunctivae normal.     Pupils: Pupils are equal, round, and reactive to light.  Cardiovascular:     Rate and Rhythm: Normal rate and regular rhythm.     Pulses: Normal pulses.  Pulmonary:     Effort: Pulmonary effort is normal. No respiratory distress.  Abdominal:     General: Bowel sounds are normal.     Palpations: Abdomen is soft.  Musculoskeletal:        General: No swelling or tenderness. Normal range of motion.     Cervical back: Normal range of motion and neck supple.  Skin:    Capillary Refill: Capillary refill takes less than 2 seconds.  Neurological:     Mental Status: He is alert and oriented to person, place, and time.     GCS: GCS eye subscore is 4. GCS verbal subscore is 5. GCS motor subscore is 6.     Cranial Nerves: Dysarthria present.     Motor: No weakness.     Coordination: Coordination is intact.     Comments: Expressive aphasia presenting as word-finding issues Pronator drift  in LUE  Psychiatric:        Attention and Perception: Attention normal.        Mood and Affect: Mood and affect normal.        Cognition and Memory: Cognition and memory normal.      Assessment/Plan: Mr. Drees had at least 2 mass lesions identified on his head CT. The largest is in the posterior left frontal lobe and parietal lobe, with acute hemorrhage  noted in the anterior aspect of this lesion. The second lesion is within the right parietal lobe. These are likely metastatic lesions given the patient's history of melanoma and the hemorrhagic nature of the lesions. Agree with starting Keppra 500 mg BID. Decadron IV has been added to hopefully help with the patient's expressive aphasia. Full metastatic work up needed with CT chest and CT abdomen/pelvis. MRI brain with BrainLab protocol ordered. Neurosurgery will continue to follow.  Viona Gilmore, DNP, AGNP-C Nurse Practitioner  [facility] [place] [address], [place], [place] [address] P: [phone]    F: [phone]  08/02/2020, 3:10 PM

## 2020-08-02 NOTE — Consult Note (Signed)
Neurology Consult  Reason for Consult: Aphasia Referring Physician: Dr. Rex Kras  CC: Aphasia  History is obtained from: Patient, Patient's husband at bedside  HPI: Bryan Hobbs is a 70 y.o. male with a medical history significant for elevated PSA due for prostate biopsy and melanoma with 3 excisions, last excision approximately 5 years ago, and borderline hypertension not on prescribed antihypertensives who presented today via POV after experiencing acute onset of aphasia while at breakfast this morning. Patient states that he is on vacation and has been traveling via a bus from [place] to [facility] and arrived at approximately 9:10 this morning when he began having word finding difficulties at breakfast. He was brought into triage where a Code Stroke was activated for further evaluation.  LKW: 10:00 tpa given?: no, ICH on CT head IR Thrombectomy? No, ICH on CTH Modified Rankin Scale: 0-Completely asymptomatic and back to baseline post- stroke  ROS: A complete ROS was performed and is negative except as noted in the HPI.   Past Medical History:  - Melanoma (3 spots removed; last removal approximately 5 years ago) - PSA elevation   Family History: Father: Myocardial Infarction Mother: Anxiety Sister: Deceased from cancer in August 02, 2013; unsure type of cancer  Social History:   has no history on file for tobacco use, alcohol use, and drug use.  Alcohol: drinks one glass of wine at dinner 4 - 5 nights per week  Medications No current facility-administered medications for this encounter. No current outpatient medications on file.  Exam: Current vital signs: BP 140/75   Pulse 69   Temp 98.4 F (36.9 C) (Oral)   Resp 17   Wt 99.2 kg   SpO2 97%  Vital signs in last 24 hours: Temp:  [98.4 F (36.9 C)] 98.4 F (36.9 C) (04/18 1157) Pulse Rate:  [69-82] 69 (04/18 1230) Resp:  [16-17] 17 (04/18 1230) BP: (140-143)/(75-87) 140/75 (04/18 1230) SpO2:  [95 %-97 %] 97 % (04/18 1230) Weight:  [99.2  kg] 99.2 kg (04/18 1200)  GENERAL: Awake, alert, in no acute distress Head: Normocephalic and atraumatic, dry mm EENT: No OP obstruction, normal conjunctivae LUNGS - Normal respiratory effort. Non-labored breathing CV: Regular rate on cardiac monitor, extremities warm and without edema ABDOMEN - Soft, non-tender, non-distended Ext: warm, well perfused, no obvious abnormality  NEURO:  Mental Status: Alert and oriented to person, place, month, age, and situation. Patient is able to provide a clear and coherent history of present illness. Speech is mildly aphasic without dysarthria. Naming mostly intact with some word-finding difficulty intermittently and inappropriate substitutions such as "I slurred my slurred" instead of saying "I slurred my speech". No neglect is noted. Follows all commands.  Cranial Nerves:  II: PERRL 3 mm/brisk. Visual fields full.  III, IV, VI: EOMI. Lid elevation symmetric and full.  V: Sensation is intact to light touch and symmetrical to face.  VII: Face is symmetric resting and smiling. Able to puff cheeks and raise eyebrows.  VIII: Hearing is intact to voice IX, X: Palate elevation is symmetric. Phonation normal.  XI: Normal sternocleidomastoid and trapezius muscle strength XII: Tongue protrudes midline without fasciculations.   Motor: 5/5 strength is all muscle groups with spontaneous antigravity movement without vertical drift on examination Tone is normal. Bulk is normal.  Sensation: Intact to light touch bilaterally in all four extremities. No extinction to DSS present.  Coordination: FTN intact bilaterally. HKS intact bilaterally. No pronator drift. DTRs: 2+ and symmetric biceps and patellae Gait: deferred  NIHSS: 1a Level  of Conscious.: 0 1b LOC Questions: 0 1c LOC Commands: 0 2 Best Gaze: 0 3 Visual: 0 4 Facial Palsy: 0 5a Motor Arm - left: 0 5b Motor Arm - Right: 0 6a Motor Leg - Left: 0 6b Motor Leg - Right: 0 7 Limb Ataxia: 0 8 Sensory: 0 9  Best Language: 1 10 Dysarthria: 0 11 Extinct. and Inatten.: 0 TOTAL: 1  Labs I have reviewed labs in epic and the results pertinent to this consultation are: CBC    Component Value Date/Time   WBC 7.5 08/02/2020 1202   RBC 5.24 08/02/2020 1202   HGB 16.7 08/02/2020 1212   HCT 49.0 08/02/2020 1212   PLT 202 08/02/2020 1202   MCV 92.9 08/02/2020 1202   MCH 30.5 08/02/2020 1202   MCHC 32.9 08/02/2020 1202   RDW 12.8 08/02/2020 1202   LYMPHSABS 1.7 08/02/2020 1202   MONOABS 0.6 08/02/2020 1202   EOSABS 0.2 08/02/2020 1202   BASOSABS 0.0 08/02/2020 1202   CMP     Component Value Date/Time   NA 142 08/02/2020 1212   K 3.9 08/02/2020 1212   CL 107 08/02/2020 1212   GLUCOSE 129 (H) 08/02/2020 1212   BUN 26 (H) 08/02/2020 1212   CREATININE 1.50 (H) 08/02/2020 1212   Lipid Panel  No results found for: CHOL, TRIG, HDL, CHOLHDL, VLDL, LDLCALC, LDLDIRECT  Imaging I have reviewed the images obtained: CT-scan of the brain: 1. At least 2 mass lesions identified. The largest is in the posterior left frontal lobe and parietal lobe. Acute hemorrhage is noted in the anterior aspect of this lesion. 2. Second lesion identified within the right parietal lobe. 3. These likely reflect metastatic lesions. 4. No additional acute hemorrhage or focal cortical infarct.  MRI examination of the brain pending  Assessment: 70 year old male who presents for evaluation of acute onset aphasia while at breakfast this morning. Pertinent history includes history of melanoma excision of skin x 3 and elevated PSA.  - Examination reveals persistent aphasia but without dysarthria. No other neurologic deficits identified. - CT head non-contrast with at least 2 mass lesions with acute hemorrhage noted on the anterior aspect of the left frontal lesion, concern for metastatic lesions. Further imaging pending for further mass evaluation versus primary site.  Highly concerning for potential metastatic melanoma given  his history - Due to cortical involvement of mass lesion and associated acute hemorrhage, Keppra initiated for seizure prophylaxis at this time.   Recommendations: - MRI brain with and without contrast; 2 mg IV Ativan for MRI tolerance - Recommend CT chest, abdomen, and pelvis - Maintain blood pressure < 140 / <90 - Keppra 500 mg BID PO (IV if needed 1:1 conversion); adjust for renal function as needed - Appreciate neurosurgical evaluation for tumor  Anibal Henderson, AGAC-NP [facility] Pager: [phone]  Attending Neurologist's note:  I personally saw this patient, gathering history, performing a neurologic examination, reviewing relevant labs, personally reviewing relevant imaging including head CT, and formulated the assessment and plan, adding the note above for completeness and clarity to accurately reflect my thoughts

## 2020-08-02 NOTE — Progress Notes (Signed)
2nd paged sent to MD. No return call from 1st page.

## 2020-08-02 NOTE — H&P (Addendum)
Date: 08/02/2020               Patient Name:  Bryan Hobbs MRN: [id number]  DOB: 1950-09-23 Age / Sex: 70 y.o., male   PCP: No primary care provider on file.         Medical Service: Internal Medicine Teaching Service         Attending Physician: Dr. Philipp Ovens    First Contact: Dr. Bridgett Larsson Pager: [phone]  Second Contact: Dr. Charleen Kirks Pager: [phone]       After Hours (After 5p/  First Contact Pager: [phone]  weekends / holidays): Second Contact Pager: [phone]   Chief Complaint: speech difficulty  History of Present Illness:   Bryan Hobbs is a 70 y.o. male with hx of HTN and melanoma presenting for speech difficulty with sudden onset this morning at breakfast. Patient and partner providing history. They are here on vacation from [place], going to [employer] to ski resort, and came down by bus overnight. He was eating when he suddenly started having difficulty finding words. Partner also noted L facial droop, and patient states that side feels "tight." No numbness, no weakness elsewhere. He had a dental procedure 3 weeks ago and states he has had a hard time forming words over this time, but his current complain is distinct.   He has a history of melanoma x3, treated with surgery alone in 1992, 2010, and 2018 with clear margins each time. Also notes elevated PSA found in December, he declined imaging at that time but was planning a biopsy in the near future. Had colonoscopy x2, last in 2018 after a trip to [place] reportedly for a parasite issue but unable to specify further. The report showed nothing concerning for malignancy. No other cancer history. No hx of HIV or other immunosuppression. 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 CT head demonstrated a 3.4 x 2.6 x 2.8 cm lesion in the left posterior frontal and parietal lobe with acute hemorrhage, and another 1.4cm lesion in the posterior right parietal lobe. These likely are  metastases. He has history of melanoma. Seen by neurology and neurosurgery.  Started on Ativan and Keppra for seizure prophylaxis, and decadron for cerebral edema.  No current outpatient medications  Allergies as of 08/02/2020 - never reviewed  Allergen Reaction Noted  . Peanut-containing drug products Swelling 09/29/2013  . Shellfish allergy Swelling 09/29/2013  . Penicillins  08/02/2020    Past Medical History:  Diagnosis Date  . Hepatitis A   . Hypertension   . Melanoma ([place])   . Pre-diabetes   . Sleep apnea    Past Surgical History:  Procedure Laterality Date  . APPENDECTOMY  2017  . CYSTOSCOPY W/ URETERAL STENT PLACEMENT    . HERNIA REPAIR    . MELANOMA EXCISION  1992, 2010, 2018     Family History  Problem Relation Age of Onset  . Cancer Sister     Social History   Tobacco Use  . Smoking status: Never Smoker  Substance Use Topics  . Alcohol use: Yes    Alcohol/week: 4.0 standard drinks    Types: 4 Glasses of wine per week  . Drug use: Never    Review of Systems: Review of Systems  Constitutional: Negative for chills, fever and weight loss (5 pound intentional weight loss).  HENT: Negative for congestion and sore throat.   Eyes: Negative for blurred vision and double vision.  Respiratory:  Negative for cough and shortness of breath.   Cardiovascular: Negative for chest pain and leg swelling.  Skin: Positive for rash (Multiple scattered lesions which are present for many years, and has good dermatology follow-up).  Neurological: Positive for speech change. Negative for dizziness, focal weakness, seizures, loss of consciousness, weakness and headaches.  All other systems reviewed and are negative.    Physical Exam: Blood pressure (!) 135/101, pulse 74, temperature 98.4 F (36.9 C), temperature source Oral, resp. rate 18, height 5\' 7"  (1.702 m), weight 99.2 kg, SpO2 95 %. Physical Exam Vitals and nursing note reviewed.  Constitutional:      General: He is  not in acute distress.    Appearance: Normal appearance. He is not ill-appearing.  HENT:     Head: Normocephalic and atraumatic.     Right Ear: External ear normal.     Left Ear: External ear normal.     Nose: Nose normal.     Mouth/Throat:     Mouth: Mucous membranes are moist.  Eyes:     Extraocular Movements: Extraocular movements intact.     Pupils: Pupils are equal, round, and reactive to light.  Cardiovascular:     Rate and Rhythm: Normal rate and regular rhythm.     Pulses: Normal pulses.     Heart sounds: No murmur heard. No friction rub. No gallop.   Pulmonary:     Effort: Pulmonary effort is normal.     Breath sounds: Normal breath sounds. No wheezing, rhonchi or rales.  Abdominal:     General: Abdomen is flat.  Musculoskeletal:        General: No signs of injury. Normal range of motion.     Cervical back: Normal range of motion.  Skin:    General: Skin is warm and dry.  Neurological:     Mental Status: He is alert and oriented to person, place, and time.     Comments: Patient has clear aphasia with word finding difficulty, he is grossly understandable but frequently mispronounces or mixes up words No difficulty at all with comprehension Has moderate right facial droop compared to the left. Sensation seems decreased on the right compared to the left Note this is the opposite side compared to his history and presentation Cranial nerves otherwise intact Strength 5 out of 5 throughout Sensation intact below the neck 2+ patellar reflex bilaterally 3+ right biceps reflex, 2+ left biceps reflex  Psychiatric:        Mood and Affect: Mood normal.        Behavior: Behavior normal.      EKG: personally reviewed my interpretation is normal sinus rhythm, no ST changes  CT HEAD CODE STROKE WO CONTRAST  Result Date: 08/02/2020 CLINICAL DATA:  Code stroke. Neuro deficit. Acute stroke suspected. New onset of aphasia and left-sided facial droop beginning 2 hours ago. EXAM:  CT HEAD WITHOUT CONTRAST TECHNIQUE: Contiguous axial images were obtained from the base of the skull through the vertex without intravenous contrast. COMPARISON:  None. FINDINGS: Brain: Mass lesion is present in the left posterior frontal and parietal lobe measuring 3.4 x 2.6 by 2.8 cm. Hyperdense material anteriorly is compatible with acute hemorrhage. A second lesion is present in the posterior right parietal lobe measuring up to 1.4 cm. Vasogenic edema surrounds both lesions. There is some local mass effect in the posterior left frontal lobe and parietal lobe with effacement of the sulci. No significant midline shift is present. The basal ganglia are intact. No acute  or focal cortical abnormalities are present. White matter is otherwise unremarkable. Vascular: No hyperdense vessel or unexpected calcification. Skull: Calvarium is intact. No focal lytic or blastic lesions are present. No significant extracranial soft tissue lesion is present. Sinuses/Orbits: Polyp or mucous retention cyst occludes the right ostiomeatal complex. Scattered opacification of ethmoid air cells is noted bilaterally. The globes and orbits are within normal limits. IMPRESSION: 1. At least 2 mass lesions identified. The largest is in the posterior left frontal lobe and parietal lobe. Acute hemorrhage is noted in the anterior aspect of this lesion. 2. Second lesion identified within the right parietal lobe. 3. These likely reflect metastatic lesions. 4. No additional acute hemorrhage or focal cortical infarct. Critical Value/emergent results were called by telephone at the time of interpretation on 08/02/2020 at 12:25 pm to provider Dr. Curly Shores, who verbally acknowledged these results. Electronically Signed   By: San Morelle M.D.   On: 08/02/2020 12:29    Assessment & Plan by Problem: Active Problems:   Brain mass   Bryan Hobbs is a 70 y.o. male with hx of hypertension and melanoma presenting for acute onset aphasia, CT  head concerning for 2 new masses with hemorrhage.  Aphasia Brain mass x2 with hemorrhage, concern for metastatic cancer Patient presenting with sudden onset aphasia and left facial droop, continues to have expressive aphasia on exam but now with right facial droop.  No similar symptoms previously. CT scan demonstrated 3.4 x 2.6 x 2.8 cm lesion in the left posterior frontal and parietal lobe with acute hemorrhage, and another 1.4cm lesion in the posterior right parietal lobe.  He had a recent CT head in November 2021 after a fall which did not demonstrate he is lesions. No active cancer history, but he does have history of melanoma x3 which was excised in 1992, 2012, 2017.  There were negative margins at each occasion.  Also was noted to have scattered pearly skin lesions throughout his entire body with previous biopsies that demonstrated basal cell carcinoma.  Currently being treated with field therapy for actinic keratosis.  Patient has very good outpatient dermatology follow-up. He is also noted to have work-up planned for elevated PSA which was 7.4 in November 21, then 7.1 in February 2022.  Followed by urology, biopsy is planned, MRI was also offered but patient declined. No known immunocompromising conditions.  Last HIV screen was November 21.  Noted to have very significant travel history to many countries including [place] and [place].  His partner reports a concern for parasitic infection in 2018 for which she had a colonoscopy, unable to specify further.  That colonoscopy was negative for malignancy, had another in 2007 which was also negative.  No smoking history.  -Neurology and neurosurgery following, patient medications - Follow-up CT chest abdomen and pelvis to look for primary cancer - Start Decadron daily per neurosurgery for cerebral edema - Start Keppra and Ativan per neurology - Neurochecks  CKD stage IIIa Creatinine 1.5 on admission, which seems close to his baseline. - Avoid  nephrotoxins  Hypertension Partner reports history of "borderline hypertension," not requiring medication. -Monitor blood pressure  Obstructive sleep apnea History of OSA but does not wear a CPAP at home as he is unable to tolerate it.  Dispo: Admit patient to Inpatient with expected length of stay greater than 2 midnights.  Signed: [name], MD 08/02/2020, 5:08 PM  Pager: [phone]  After 5pm on weekdays and 1pm on weekends: On Call pager: [phone]

## 2020-08-02 NOTE — Progress Notes (Signed)
Dr. Charleen Kirks called and gave clearance for [facility] to stay the night under circumstances of new diagnosis and being out of town.   Justice Rocher, RN

## 2020-08-02 NOTE — Progress Notes (Signed)
MD paged. Pt with new onset of numbness to RUE from finger tips to shoulder. Awaiting return call

## 2020-08-03 ENCOUNTER — Observation Stay (HOSPITAL_COMMUNITY): Payer: PRIVATE HEALTH INSURANCE

## 2020-08-03 DIAGNOSIS — C61 Malignant neoplasm of prostate: Secondary | ICD-10-CM | POA: Diagnosis not present

## 2020-08-03 DIAGNOSIS — Z6834 Body mass index (BMI) 34.0-34.9, adult: Secondary | ICD-10-CM | POA: Diagnosis not present

## 2020-08-03 DIAGNOSIS — G936 Cerebral edema: Secondary | ICD-10-CM | POA: Diagnosis present

## 2020-08-03 DIAGNOSIS — Z01810 Encounter for preprocedural cardiovascular examination: Secondary | ICD-10-CM | POA: Diagnosis not present

## 2020-08-03 DIAGNOSIS — F40240 Claustrophobia: Secondary | ICD-10-CM | POA: Diagnosis present

## 2020-08-03 DIAGNOSIS — R4702 Dysphasia: Secondary | ICD-10-CM | POA: Diagnosis present

## 2020-08-03 DIAGNOSIS — M48061 Spinal stenosis, lumbar region without neurogenic claudication: Secondary | ICD-10-CM | POA: Diagnosis present

## 2020-08-03 DIAGNOSIS — I611 Nontraumatic intracerebral hemorrhage in hemisphere, cortical: Secondary | ICD-10-CM | POA: Diagnosis present

## 2020-08-03 DIAGNOSIS — R32 Unspecified urinary incontinence: Secondary | ICD-10-CM | POA: Diagnosis not present

## 2020-08-03 DIAGNOSIS — C4442 Squamous cell carcinoma of skin of scalp and neck: Secondary | ICD-10-CM | POA: Diagnosis present

## 2020-08-03 DIAGNOSIS — R4701 Aphasia: Secondary | ICD-10-CM | POA: Diagnosis present

## 2020-08-03 DIAGNOSIS — Z20822 Contact with and (suspected) exposure to covid-19: Secondary | ICD-10-CM | POA: Diagnosis present

## 2020-08-03 DIAGNOSIS — N4289 Other specified disorders of prostate: Secondary | ICD-10-CM | POA: Diagnosis not present

## 2020-08-03 DIAGNOSIS — C7931 Secondary malignant neoplasm of brain: Secondary | ICD-10-CM | POA: Diagnosis not present

## 2020-08-03 DIAGNOSIS — C713 Malignant neoplasm of parietal lobe: Secondary | ICD-10-CM | POA: Diagnosis present

## 2020-08-03 DIAGNOSIS — R972 Elevated prostate specific antigen [PSA]: Secondary | ICD-10-CM

## 2020-08-03 DIAGNOSIS — I619 Nontraumatic intracerebral hemorrhage, unspecified: Secondary | ICD-10-CM | POA: Diagnosis not present

## 2020-08-03 DIAGNOSIS — G992 Myelopathy in diseases classified elsewhere: Secondary | ICD-10-CM | POA: Diagnosis present

## 2020-08-03 DIAGNOSIS — R471 Dysarthria and anarthria: Secondary | ICD-10-CM | POA: Diagnosis present

## 2020-08-03 DIAGNOSIS — E669 Obesity, unspecified: Secondary | ICD-10-CM | POA: Diagnosis present

## 2020-08-03 DIAGNOSIS — I129 Hypertensive chronic kidney disease with stage 1 through stage 4 chronic kidney disease, or unspecified chronic kidney disease: Secondary | ICD-10-CM | POA: Diagnosis present

## 2020-08-03 DIAGNOSIS — R29810 Facial weakness: Secondary | ICD-10-CM | POA: Diagnosis present

## 2020-08-03 DIAGNOSIS — G4733 Obstructive sleep apnea (adult) (pediatric): Secondary | ICD-10-CM | POA: Diagnosis present

## 2020-08-03 DIAGNOSIS — G939 Disorder of brain, unspecified: Secondary | ICD-10-CM | POA: Diagnosis not present

## 2020-08-03 DIAGNOSIS — B368 Other specified superficial mycoses: Secondary | ICD-10-CM | POA: Diagnosis present

## 2020-08-03 DIAGNOSIS — I700 Atherosclerosis of aorta: Secondary | ICD-10-CM | POA: Diagnosis present

## 2020-08-03 DIAGNOSIS — L570 Actinic keratosis: Secondary | ICD-10-CM | POA: Diagnosis present

## 2020-08-03 DIAGNOSIS — M4802 Spinal stenosis, cervical region: Secondary | ICD-10-CM | POA: Diagnosis not present

## 2020-08-03 DIAGNOSIS — G9389 Other specified disorders of brain: Secondary | ICD-10-CM | POA: Diagnosis not present

## 2020-08-03 DIAGNOSIS — I618 Other nontraumatic intracerebral hemorrhage: Secondary | ICD-10-CM | POA: Diagnosis not present

## 2020-08-03 DIAGNOSIS — N1831 Chronic kidney disease, stage 3a: Secondary | ICD-10-CM | POA: Diagnosis present

## 2020-08-03 DIAGNOSIS — G8191 Hemiplegia, unspecified affecting right dominant side: Secondary | ICD-10-CM | POA: Diagnosis present

## 2020-08-03 DIAGNOSIS — D496 Neoplasm of unspecified behavior of brain: Secondary | ICD-10-CM | POA: Diagnosis not present

## 2020-08-03 LAB — COMPREHENSIVE METABOLIC PANEL
ALT: 21 U/L (ref 0–44)
AST: 19 U/L (ref 15–41)
Albumin: 3.7 g/dL (ref 3.5–5.0)
Alkaline Phosphatase: 37 U/L — ABNORMAL LOW (ref 38–126)
Anion gap: 9 (ref 5–15)
BUN: 18 mg/dL (ref 8–23)
CO2: 23 mmol/L (ref 22–32)
Calcium: 9.4 mg/dL (ref 8.9–10.3)
Chloride: 108 mmol/L (ref 98–111)
Creatinine, Ser: 1.47 mg/dL — ABNORMAL HIGH (ref 0.61–1.24)
GFR, Estimated: 51 mL/min — ABNORMAL LOW (ref >60)
Glucose, Bld: 151 mg/dL — ABNORMAL HIGH (ref 70–99)
Potassium: 4.5 mmol/L (ref 3.5–5.1)
Sodium: 140 mmol/L (ref 135–145)
Total Bilirubin: 0.7 mg/dL (ref 0.3–1.2)
Total Protein: 6.8 g/dL (ref 6.5–8.1)

## 2020-08-03 LAB — CBC WITH DIFFERENTIAL/PLATELET
Abs Immature Granulocytes: 0.06 10*3/uL (ref 0.00–0.07)
Basophils Absolute: 0 10*3/uL (ref 0.0–0.1)
Basophils Relative: 0 %
Eosinophils Absolute: 0 10*3/uL (ref 0.0–0.5)
Eosinophils Relative: 0 %
HCT: 50.9 % (ref 39.0–52.0)
Hemoglobin: 16.4 g/dL (ref 13.0–17.0)
Immature Granulocytes: 1 %
Lymphocytes Relative: 9 %
Lymphs Abs: 0.8 10*3/uL (ref 0.7–4.0)
MCH: 29.8 pg (ref 26.0–34.0)
MCHC: 32.2 g/dL (ref 30.0–36.0)
MCV: 92.5 fL (ref 80.0–100.0)
Monocytes Absolute: 0.1 10*3/uL (ref 0.1–1.0)
Monocytes Relative: 1 %
Neutro Abs: 7.1 10*3/uL (ref 1.7–7.7)
Neutrophils Relative %: 89 %
Platelets: 228 10*3/uL (ref 150–400)
RBC: 5.5 MIL/uL (ref 4.22–5.81)
RDW: 12.5 % (ref 11.5–15.5)
WBC: 8 10*3/uL (ref 4.0–10.5)
nRBC: 0 % (ref 0.0–0.2)

## 2020-08-03 LAB — MRSA PCR SCREENING: MRSA by PCR: NEGATIVE

## 2020-08-03 LAB — HIV ANTIBODY (ROUTINE TESTING W REFLEX): HIV Screen 4th Generation wRfx: NONREACTIVE

## 2020-08-03 NOTE — Progress Notes (Signed)
Spoke with pt and partner. Partner stated pt is claustrophobic and was not able to do the MRI with the light sedative they gave. Stated, " the neuro doctors said they might have to put him under to obtain MRI."

## 2020-08-03 NOTE — Progress Notes (Signed)
EEG completed, results pending. 

## 2020-08-03 NOTE — Consult Note (Signed)
Reason for Consult: Brain lesions Referring Physician: Dr. Corena Pilgrim is an 70 y.o. male.  HPI: The patient is a 70 year old white male who presented to the ER yesterday with slurred speech.  He was worked up with a head CT which demonstrated a hemorrhagic left parietal lesion as well as edema is right occipital lobe.  He was admitted by the internist for further work-up.  An attempt was made to get a brain MRI yesterday but he could not tolerate it.  It has been rescheduled for this morning.  A neurosurgical consultation was requested.  Presently the patient is alert and pleasant.  His partner is at the bedside.  He denies headaches.  He has difficulty with his speech.  He has had some difficulty using his right hand.  There is no history of seizures.  He does have a history significant for 3 melanomas diagnosed and resected in 1992, 2010 and 2018.  He is not anticoagulated.  Past Medical History:  Diagnosis Date  . Hepatitis A   . Hypertension   . Melanoma ([place])   . Pre-diabetes   . Sleep apnea     Past Surgical History:  Procedure Laterality Date  . APPENDECTOMY  2017  . CYSTOSCOPY W/ URETERAL STENT PLACEMENT    . HERNIA REPAIR    . MELANOMA EXCISION  1992, 2010, 2018    Family History  Problem Relation Age of Onset  . Cancer Sister     Social History:  reports that he has never smoked. He does not have any smokeless tobacco history on file. He reports current alcohol use of about 4.0 standard drinks of alcohol per week. He reports that he does not use drugs.  Allergies:  Allergies  Allergen Reactions  . Peanut-Containing Drug Products Swelling  . Shellfish Allergy Swelling  . Penicillins     He was told by a parent that he had an allergy as a child    Medications:  I have reviewed the patient's current medications. Prior to Admission:  Medications Prior to Admission  Medication Sig Dispense Refill Last Dose  . clobetasol ointment (TEMOVATE) 7.41 % Apply  1 application topically 2 (two) times daily.     Marland Kitchen EPINEPHrine 0.3 mg/0.3 mL IJ SOAJ injection Inject 0.3 mg into the muscle once as needed.     . fluorouracil (EFUDEX) 5 % cream Apply 1 application topically 2 (two) times daily.      Scheduled: . dexamethasone (DECADRON) injection  4 mg Intravenous Q6H  . dexamethasone (DECADRON) injection  6 mg Intravenous Q6H  . levETIRAcetam  500 mg Oral BID  . multivitamin with minerals  1 tablet Oral Daily  . sodium chloride flush  3 mL Intravenous Q12H   Continuous:  OIN:OMVEHMCNOBSJG **OR** acetaminophen, ondansetron **OR** ondansetron (ZOFRAN) IV, polyethylene glycol Anti-infectives (From admission, onward)   None       Results for orders placed or performed during the hospital encounter of 08/02/20 (from the past 48 hour(s))  Protime-INR     Status: None   Collection Time: 08/02/20 12:02 PM  Result Value Ref Range   Prothrombin Time 12.9 11.4 - 15.2 seconds   INR 1.0 0.8 - 1.2    Comment: (NOTE) INR goal varies based on device and disease states. Performed at [facility], [place] [address] [place], [place] [address]   APTT     Status: None   Collection Time: 08/02/20 12:02 PM  Result Value Ref Range   aPTT 25  24 - 36 seconds    Comment: Performed at [facility], [place] [address] [place], [place] [address]  CBC     Status: None   Collection Time: 08/02/20 12:02 PM  Result Value Ref Range   WBC 7.5 4.0 - 10.5 K/uL   RBC 5.24 4.22 - 5.81 MIL/uL   Hemoglobin 16.0 13.0 - 17.0 g/dL   HCT 48.7 39.0 - 52.0 %   MCV 92.9 80.0 - 100.0 fL   MCH 30.5 26.0 - 34.0 pg   MCHC 32.9 30.0 - 36.0 g/dL   RDW 12.8 11.5 - 15.5 %   Platelets 202 150 - 400 K/uL   nRBC 0.0 0.0 - 0.2 %    Comment: Performed at [facility], [place] [address], [place], [place] [address]  Differential     Status: None   Collection Time: 08/02/20 12:02 PM  Result Value Ref Range   Neutrophils Relative % 66 %   Neutro Abs 5.0 1.7 - 7.7 K/uL    Lymphocytes Relative 22 %   Lymphs Abs 1.7 0.7 - 4.0 K/uL   Monocytes Relative 8 %   Monocytes Absolute 0.6 0.1 - 1.0 K/uL   Eosinophils Relative 3 %   Eosinophils Absolute 0.2 0.0 - 0.5 K/uL   Basophils Relative 0 %   Basophils Absolute 0.0 0.0 - 0.1 K/uL   Immature Granulocytes 1 %   Abs Immature Granulocytes 0.06 0.00 - 0.07 K/uL    Comment: Performed at [facility] [address] [place], [place] [address]  Comprehensive metabolic panel     Status: Abnormal   Collection Time: 08/02/20 12:02 PM  Result Value Ref Range   Sodium 139 135 - 145 mmol/L   Potassium 3.9 3.5 - 5.1 mmol/L   Chloride 106 98 - 111 mmol/L   CO2 24 22 - 32 mmol/L   Glucose, Bld 129 (H) 70 - 99 mg/dL    Comment: Glucose reference range applies only to samples taken after fasting for at least 8 hours.   BUN 22 8 - 23 mg/dL   Creatinine, Ser 1.68 (H) 0.61 - 1.24 mg/dL   Calcium 9.4 8.9 - 10.3 mg/dL   Total Protein 7.0 6.5 - 8.1 g/dL   Albumin 4.0 3.5 - 5.0 g/dL   AST 20 15 - 41 U/L   ALT 20 0 - 44 U/L   Alkaline Phosphatase 34 (L) 38 - 126 U/L   Total Bilirubin 0.9 0.3 - 1.2 mg/dL   GFR, Estimated 44 (L) >60 mL/min    Comment: (NOTE) Calculated using the CKD-EPI Creatinine Equation (2021)    Anion gap 9 5 - 15    Comment: Performed at [facility] [address], [place], [place] [address]  CBG monitoring, ED     Status: Abnormal   Collection Time: 08/02/20 12:05 PM  Result Value Ref Range   Glucose-Capillary 134 (H) 70 - 99 mg/dL    Comment: Glucose reference range applies only to samples taken after fasting for at least 8 hours.  I-stat chem 8, ED     Status: Abnormal   Collection Time: 08/02/20 12:12 PM  Result Value Ref Range   Sodium 142 135 - 145 mmol/L   Potassium 3.9 3.5 - 5.1 mmol/L   Chloride 107 98 - 111 mmol/L   BUN 26 (H) 8 - 23 mg/dL   Creatinine, Ser 1.50 (H) 0.61 - 1.24 mg/dL   Glucose, Bld 129 (H) 70 - 99  mg/dL    Comment: Glucose reference range applies only to  samples taken after fasting for at least 8 hours.   Calcium, Ion 1.12 (L) 1.15 - 1.40 mmol/L   TCO2 24 22 - 32 mmol/L   Hemoglobin 16.7 13.0 - 17.0 g/dL   HCT 49.0 39.0 - 52.0 %  Resp Panel by RT-PCR (Flu A&B, Covid) Nasopharyngeal Swab     Status: None   Collection Time: 08/02/20  3:20 PM   Specimen: Nasopharyngeal Swab; Nasopharyngeal(NP) swabs in vial transport medium  Result Value Ref Range   SARS Coronavirus 2 by RT PCR NEGATIVE NEGATIVE    Comment: (NOTE) SARS-CoV-2 target nucleic acids are NOT DETECTED.  The SARS-CoV-2 RNA is generally detectable in upper respiratory specimens during the acute phase of infection. The lowest concentration of SARS-CoV-2 viral copies this assay can detect is 138 copies/mL. A negative result does not preclude SARS-Cov-2 infection and should not be used as the sole basis for treatment or other patient management decisions. A negative result may occur with  improper specimen collection/handling, submission of specimen other than nasopharyngeal swab, presence of viral mutation(s) within the areas targeted by this assay, and inadequate number of viral copies(<138 copies/mL). A negative result must be combined with clinical observations, patient history, and epidemiological information. The expected result is Negative.  Fact Sheet for Patients:  EntrepreneurPulse.com.au  Fact Sheet for Healthcare Providers:  IncredibleEmployment.be  This test is no t yet approved or cleared by the [place] FDA and  has been authorized for detection and/or diagnosis of SARS-CoV-2 by FDA under an Emergency Use Authorization (EUA). This EUA will remain  in effect (meaning this test can be used) for the duration of the COVID-19 declaration under Section 564(b)(1) of the Act, 21 U.S.C.section 360bbb-3(b)(1), unless the authorization is terminated  or revoked sooner.       Influenza A by PCR NEGATIVE NEGATIVE   Influenza B by  PCR NEGATIVE NEGATIVE    Comment: (NOTE) The Xpert Xpress SARS-CoV-2/FLU/RSV plus assay is intended as an aid in the diagnosis of influenza from Nasopharyngeal swab specimens and should not be used as a sole basis for treatment. Nasal washings and aspirates are unacceptable for Xpert Xpress SARS-CoV-2/FLU/RSV testing.  Fact Sheet for Patients: EntrepreneurPulse.com.au  Fact Sheet for Healthcare Providers: IncredibleEmployment.be  This test is not yet approved or cleared by the [place] FDA and has been authorized for detection and/or diagnosis of SARS-CoV-2 by FDA under an Emergency Use Authorization (EUA). This EUA will remain in effect (meaning this test can be used) for the duration of the COVID-19 declaration under Section 564(b)(1) of the Act, 21 U.S.C. section 360bbb-3(b)(1), unless the authorization is terminated or revoked.  Performed at [facility], [place] [address], [place] [address]   CBC with Differential/Platelet     Status: None   Collection Time: 08/03/20  5:47 AM  Result Value Ref Range   WBC 8.0 4.0 - 10.5 K/uL   RBC 5.50 4.22 - 5.81 MIL/uL   Hemoglobin 16.4 13.0 - 17.0 g/dL   HCT 50.9 39.0 - 52.0 %   MCV 92.5 80.0 - 100.0 fL   MCH 29.8 26.0 - 34.0 pg   MCHC 32.2 30.0 - 36.0 g/dL   RDW 12.5 11.5 - 15.5 %   Platelets 228 150 - 400 K/uL   nRBC 0.0 0.0 - 0.2 %   Neutrophils Relative % 89 %   Neutro Abs 7.1 1.7 - 7.7 K/uL   Lymphocytes Relative  9 %   Lymphs Abs 0.8 0.7 - 4.0 K/uL   Monocytes Relative 1 %   Monocytes Absolute 0.1 0.1 - 1.0 K/uL   Eosinophils Relative 0 %   Eosinophils Absolute 0.0 0.0 - 0.5 K/uL   Basophils Relative 0 %   Basophils Absolute 0.0 0.0 - 0.1 K/uL   Immature Granulocytes 1 %   Abs Immature Granulocytes 0.06 0.00 - 0.07 K/uL    Comment: Performed at [facility], [place] [address] [place], [place] [address]  Comprehensive metabolic panel     Status: Abnormal    Collection Time: 08/03/20  5:47 AM  Result Value Ref Range   Sodium 140 135 - 145 mmol/L   Potassium 4.5 3.5 - 5.1 mmol/L   Chloride 108 98 - 111 mmol/L   CO2 23 22 - 32 mmol/L   Glucose, Bld 151 (H) 70 - 99 mg/dL    Comment: Glucose reference range applies only to samples taken after fasting for at least 8 hours.   BUN 18 8 - 23 mg/dL   Creatinine, Ser 1.47 (H) 0.61 - 1.24 mg/dL   Calcium 9.4 8.9 - 10.3 mg/dL   Total Protein 6.8 6.5 - 8.1 g/dL   Albumin 3.7 3.5 - 5.0 g/dL   AST 19 15 - 41 U/L   ALT 21 0 - 44 U/L   Alkaline Phosphatase 37 (L) 38 - 126 U/L   Total Bilirubin 0.7 0.3 - 1.2 mg/dL   GFR, Estimated 51 (L) >60 mL/min    Comment: (NOTE) Calculated using the CKD-EPI Creatinine Equation (2021)    Anion gap 9 5 - 15    Comment: Performed at [facility] [address] [place], [place] [address]    CT Chest W Contrast  Result Date: 08/02/2020 CLINICAL DATA:  CNS neoplasm, staging evaluation EXAM: CT CHEST, ABDOMEN, AND PELVIS WITH CONTRAST TECHNIQUE: Multidetector CT imaging of the chest, abdomen and pelvis was performed following the standard protocol during bolus administration of intravenous contrast. CONTRAST:  72mL OMNIPAQUE IOHEXOL 300 MG/ML  SOLN COMPARISON:  None. FINDINGS: CT CHEST FINDINGS Cardiovascular: The heart is normal in size. No pericardial effusion. No evidence of thoracic aortic aneurysm. Coronary atherosclerosis of the LAD. Mediastinum/Nodes: No suspicious mediastinal lymphadenopathy. Visualized thyroid is unremarkable. Lungs/Pleura: Mild dependent atelectasis in the bilateral upper and lower lobes. No focal consolidation. No suspicious pulmonary nodules. No pleural effusion or pneumothorax. Musculoskeletal: Degenerative changes of the thoracic spine. CT ABDOMEN PELVIS FINDINGS Hepatobiliary: Liver is notable for hepatic steatosis with focal fatty sparing along the gallbladder fossa. Gallbladder is unremarkable. No intrahepatic or extrahepatic ductal  dilatation. Pancreas: Within normal limits. Spleen: Within normal limits, noting a splenule in the splenic hilum (series 3/image 56). Adrenals/Urinary Tract: Adrenal glands are within normal limits. Bilateral renal cysts, measuring up to 5.5 cm in the anterior left upper kidney (series 3/image 62). 2 mm nonobstructing left lower pole renal calculus (series 3/image 69). No ureteral or bladder calculi. No hydronephrosis. Bladder is within normal limits. Stomach/Bowel: Stomach is within normal limits. No evidence of bowel obstruction. Prior appendectomy. Mild left colonic diverticulosis, without evidence of diverticulitis. Vascular/Lymphatic: No evidence of abdominal aortic aneurysm. Mild atherosclerotic calcifications of the abdominal aorta. No suspicious abdominopelvic lymphadenopathy. Reproductive: Prostate is grossly unremarkable. Other: No abdominopelvic ascites. Tiny fat containing bilateral inguinal hernias (series 3/image 113). Musculoskeletal: Degenerative changes of the lumbar spine. IMPRESSION: No CT findings suspicious for primary malignancy in the chest, abdomen, or pelvis. Electronically Signed   By:  Julian Hy M.D.   On: 08/02/2020 17:04   CT Abdomen Pelvis W Contrast  Result Date: 08/02/2020 CLINICAL DATA:  CNS neoplasm, staging evaluation EXAM: CT CHEST, ABDOMEN, AND PELVIS WITH CONTRAST TECHNIQUE: Multidetector CT imaging of the chest, abdomen and pelvis was performed following the standard protocol during bolus administration of intravenous contrast. CONTRAST:  78mL OMNIPAQUE IOHEXOL 300 MG/ML  SOLN COMPARISON:  None. FINDINGS: CT CHEST FINDINGS Cardiovascular: The heart is normal in size. No pericardial effusion. No evidence of thoracic aortic aneurysm. Coronary atherosclerosis of the LAD. Mediastinum/Nodes: No suspicious mediastinal lymphadenopathy. Visualized thyroid is unremarkable. Lungs/Pleura: Mild dependent atelectasis in the bilateral upper and lower lobes. No focal consolidation.  No suspicious pulmonary nodules. No pleural effusion or pneumothorax. Musculoskeletal: Degenerative changes of the thoracic spine. CT ABDOMEN PELVIS FINDINGS Hepatobiliary: Liver is notable for hepatic steatosis with focal fatty sparing along the gallbladder fossa. Gallbladder is unremarkable. No intrahepatic or extrahepatic ductal dilatation. Pancreas: Within normal limits. Spleen: Within normal limits, noting a splenule in the splenic hilum (series 3/image 56). Adrenals/Urinary Tract: Adrenal glands are within normal limits. Bilateral renal cysts, measuring up to 5.5 cm in the anterior left upper kidney (series 3/image 62). 2 mm nonobstructing left lower pole renal calculus (series 3/image 69). No ureteral or bladder calculi. No hydronephrosis. Bladder is within normal limits. Stomach/Bowel: Stomach is within normal limits. No evidence of bowel obstruction. Prior appendectomy. Mild left colonic diverticulosis, without evidence of diverticulitis. Vascular/Lymphatic: No evidence of abdominal aortic aneurysm. Mild atherosclerotic calcifications of the abdominal aorta. No suspicious abdominopelvic lymphadenopathy. Reproductive: Prostate is grossly unremarkable. Other: No abdominopelvic ascites. Tiny fat containing bilateral inguinal hernias (series 3/image 113). Musculoskeletal: Degenerative changes of the lumbar spine. IMPRESSION: No CT findings suspicious for primary malignancy in the chest, abdomen, or pelvis. Electronically Signed   By: Julian Hy M.D.   On: 08/02/2020 17:04   CT HEAD CODE STROKE WO CONTRAST  Result Date: 08/02/2020 CLINICAL DATA:  Code stroke. Neuro deficit. Acute stroke suspected. New onset of aphasia and left-sided facial droop beginning 2 hours ago. EXAM: CT HEAD WITHOUT CONTRAST TECHNIQUE: Contiguous axial images were obtained from the base of the skull through the vertex without intravenous contrast. COMPARISON:  None. FINDINGS: Brain: Mass lesion is present in the left posterior  frontal and parietal lobe measuring 3.4 x 2.6 by 2.8 cm. Hyperdense material anteriorly is compatible with acute hemorrhage. A second lesion is present in the posterior right parietal lobe measuring up to 1.4 cm. Vasogenic edema surrounds both lesions. There is some local mass effect in the posterior left frontal lobe and parietal lobe with effacement of the sulci. No significant midline shift is present. The basal ganglia are intact. No acute or focal cortical abnormalities are present. White matter is otherwise unremarkable. Vascular: No hyperdense vessel or unexpected calcification. Skull: Calvarium is intact. No focal lytic or blastic lesions are present. No significant extracranial soft tissue lesion is present. Sinuses/Orbits: Polyp or mucous retention cyst occludes the right ostiomeatal complex. Scattered opacification of ethmoid air cells is noted bilaterally. The globes and orbits are within normal limits. IMPRESSION: 1. At least 2 mass lesions identified. The largest is in the posterior left frontal lobe and parietal lobe. Acute hemorrhage is noted in the anterior aspect of this lesion. 2. Second lesion identified within the right parietal lobe. 3. These likely reflect metastatic lesions. 4. No additional acute hemorrhage or focal cortical infarct. Critical Value/emergent results were called by telephone at the time of interpretation on 08/02/2020 at  12:25 pm to provider Dr. Curly Shores, who verbally acknowledged these results. Electronically Signed   By: San Morelle M.D.   On: 08/02/2020 12:29    ROS: As above Blood pressure 132/74, pulse 77, temperature 97.6 F (36.4 C), temperature source Oral, resp. rate 16, height 5\' 7"  (1.702 m), weight 99.2 kg, SpO2 94 %. Estimated body mass index is 34.25 kg/m as calculated from the following:   Height as of this encounter: 5\' 7"  (1.702 m).   Weight as of this encounter: 99.2 kg.  Physical Exam  General: An alert and pleasant 70 year old aphasic  white male in no apparent distress.  HEENT: Normocephalic, atraumatic, pupils equal extraocular muscles intact  Neck: Unremarkable  Thorax: Symmetric  Abdomen: Soft  Extremities: Unremarkable  Neurologic exam: The patient is alert and oriented.  He is dysphasic with both a motor and expressive dysphagia.  He is weak in his right hand.  Vision and hearing are grossly normal.  Sensory function is unremarkable in all tested dermatomes.  Imaging studies I have reviewed the patient's head CT performed at [facility] yesterday.  He has a hemorrhagic left parietal lesion.  There is a hypodensity in the right occipital lobe.  The patient's CT of the chest abdomen pelvis did not demonstrate any other lesions by report.  Assessment/Plan: Brain lesions: I have discussed the situation with the patient and his partner.  He has 2 lesions, consistent with metastasis.  The left lesion has hemorrhaged.  This is most consistent with metastasis from melanoma.  We are awaiting his brain MRI in order to help [place] determine a treatment plan.  Ophelia Charter 08/03/2020, 8:21 AM

## 2020-08-03 NOTE — Progress Notes (Signed)
Subjective: Bryan Hobbs was able to get good rest last night. He denies any new neurologic changes. Still endorses his right hand feels different compared to left. Still has difficulty with word finding and expressing what he wants to say, but comprehension is good. Aphasia does seem progressed compared to yesterday. Writing is not better compared to speaking. No shortness of breath or headache.   Partner notes his O2 level dropped overnight which is why the oxygen was placed. He has a reported history of OSA, but was not able to wear CPAP due to claustrophobia.  Discussed plan for MRI with sedation today to better characterize the brain lesions. He has been under general anesthesia before without complications. Discussed risk vs benefits of undergoing MRI with sedation, and he has elected to proceed. Also suggested to go ahead and get prostate MRI while he is under to rule out prostate as primary for metastatic lesions which he is agreeable with.  Objective:  Vital signs in last 24 hours: Vitals:   08/02/20 1839 08/02/20 1955 08/02/20 2355 08/03/20 0440  BP: 108/74 103/67 109/80 103/67  Pulse: 72 73 73 69  Resp: 19 16 14 18   Temp: (!) 97.5 F (36.4 C) 97.9 F (36.6 C) (!) 97.5 F (36.4 C) 97.8 F (36.6 C)  TempSrc: Oral Oral Oral Oral  SpO2: 94% 95% 95% 92%  Weight:      Height:        Physical Exam Constitutional: no acute distress Head: atraumatic ENT: external ears normal Eyes: EOMI Cardiovascular: regular rate and rhythm, normal heart sounds Pulmonary: effort normal, lungs clear to ascultation bilaterally Abdominal: flat Skin: warm and dry Neurological: alert. Still demonstrates dysarthria and expressive aphasia with word finding difficulties. RUE distal motor strength is 4/5 compared to left.  Psychiatric: normal mood and affect  Assessment/Plan: Bryan Hobbs is a 70 y.o. male with hx of hypertension and melanoma presenting for acute onset aphasia, CT head  concerning for 2 new masses with hemorrhage.   Active Problems:   Brain mass  Aphasia Brain mass x2 with hemorrhage, concern for metastatic cancer Patient presenting with sudden onset aphasia and left facial droop. CT demonstrated 2 masses new from last scan in November, one with hemorrhage. Hx of melanoma x3 excised in  1992, 2012, 2017. CT abd, pelvis, and chest without evidence of primary tumor. No know immunocompromising conditions, HIV negative. Noted to have significant travel history to many countries including in [place] and [place].   - Neurology and neurosurgery following, patient medications - Unable to tolerate MRI, willing to try again today with general anesthesia - Will also obtain prostate MRI while sedated - Decadron daily per neurosurgery for cerebral edema - Keppra and Ativan per neurology - Neuro checks  We discussed the risks and benefits of general anesthesia to obtain an MRI. Patient understands and consents to this procedure. We also discussed getting a prostate MRI while he is under anesthesia. Spoke with MRI department who will try to arrange for anesthesia today, but noted they have several procedures already on the schedule. They stated that adding the prostate MRI would make it a longer test and make scheduling difficult, I asked to prioritize the brain and defer the prostate to another time if that would make the difference between getting the brain MRI today vs tomorrow.   CKD stage IIIa Creatinine 1.5 on admission, which seems close to his baseline. - Avoid nephrotoxins   Diet:  NPO for MRI with general anesthesia IVF:  none VTE:  SCD Prior to Admission Living Arrangement:  home Anticipated Discharge Location:  Home Barriers to Discharge:  Medical workup, possible treatment for brain masses Dispo: Anticipated discharge in approximately 2-5 day(s).   [name], MD 08/03/2020, 7:05 AM Pager: [phone] After 5pm on weekdays and 1pm on  weekends: On Call pager [phone]

## 2020-08-03 NOTE — Progress Notes (Signed)
SLP Cancellation Note  Patient Details Name: Bryan Hobbs MRN: [id number] DOB: 03/24/1951   Cancelled treatment:       Reason Eval/Treat Not Completed: Medical issues which prohibited therapy. Pt currently NPO pending MRI, which is to be done under anesthesia. Will f/u as schedule allows post-MRI when he can attempt PO trials.    Osie Bond., M.A. [facility] Pager [phone] Office [phone]  08/03/2020, 10:31 AM

## 2020-08-03 NOTE — Evaluation (Signed)
Clinical/Bedside Swallow Evaluation Patient Details  Name: Bryan Hobbs MRN: [id number] Date of Birth: April 21, 1950  Today's Date: 08/03/2020 Time: SLP Start Time (ACUTE ONLY): 7681 SLP Stop Time (ACUTE ONLY): 1415 SLP Time Calculation (min) (ACUTE ONLY): 34 min  Past Medical History:  Past Medical History:  Diagnosis Date  . Hepatitis A   . Hypertension   . Melanoma ([place])   . Pre-diabetes   . Sleep apnea    Past Surgical History:  Past Surgical History:  Procedure Laterality Date  . APPENDECTOMY  2017  . CYSTOSCOPY W/ URETERAL STENT PLACEMENT    . HERNIA REPAIR    . [place], 2010, 2018   HPI:  Pt is a 70 yo male presenting with aphasia and slurred speech. CT Head showed a 3.4 x 2.6 x 2.8 cm lesion in the left posterior frontal and parietal lobe with acute hemorrhage, and another 1.4 cm lesion in the posterior right parietal lobe. Concern is for metastatic lesions, with additional w/u underway. PMH includes: HTN, melanoma x3 s/p surgery, recent dental work (~3 weeks PTA, after which mild changes in his speech may have been noted)   Assessment / Plan / Recommendation Clinical Impression  Pt has CN V impairment noted on his RIGHT side - note that previous documentation and pt/spouse have reported facial droop on his LEFT side (RN made aware and says that he has had a right sided droop all day today; MRI is still pending). Despite what appears to be sensory and motor difficulties, pt seems to have only mild impairment with PO intake. Mild R buccal pocketing is noted with solids, as is delayed coughing that he attributes to the sensation of something feeling stuck on the R side of his neck. This was alleviated with a liquid wash. Recommend starting with Dys 3 solids and thin liquids. SLP will f/u for tolerance and potential to advance. SLP Visit Diagnosis: Dysphagia, unspecified (R13.10)    Aspiration Risk  Mild aspiration risk    Diet Recommendation Dysphagia 3 (Mech  soft);Thin liquid   Liquid Administration via: Cup;Straw Medication Administration: Whole meds with liquid Supervision: Patient able to self feed;Intermittent supervision to cue for compensatory strategies Compensations: Slow rate;Small sips/bites;Lingual sweep for clearance of pocketing;Follow solids with liquid Postural Changes: Seated upright at 90 degrees    Other  Recommendations Oral Care Recommendations: Oral care BID   Follow up Recommendations  (tba)      Frequency and Duration min 2x/week  2 weeks       Prognosis Prognosis for Safe Diet Advancement: Good Barriers to Reach Goals: Language deficits      Swallow Study   General HPI: Pt is a 70 yo male presenting with aphasia and slurred speech. CT Head showed a 3.4 x 2.6 x 2.8 cm lesion in the left posterior frontal and parietal lobe with acute hemorrhage, and another 1.4 cm lesion in the posterior right parietal lobe. Concern is for metastatic lesions, with additional w/u underway. PMH includes: HTN, melanoma x3 s/p surgery, recent dental work (~3 weeks PTA, after which mild changes in his speech may have been noted) Type of Study: Bedside Swallow Evaluation Previous Swallow Assessment: none in chart Diet Prior to this Study: NPO Temperature Spikes Noted: No Respiratory Status: Nasal cannula History of Recent Intubation: No Behavior/Cognition: Alert;Cooperative;Pleasant mood Oral Cavity Assessment: Within Functional Limits Oral Care Completed by SLP: No Oral Cavity - Dentition: Adequate natural dentition Vision: Functional for self-feeding Self-Feeding Abilities: Able to feed self;Needs assist Patient Positioning:  Upright in bed Baseline Vocal Quality: Normal Volitional Cough: Strong Volitional Swallow: Able to elicit    Oral/Motor/Sensory Function Overall Oral Motor/Sensory Function: Mild impairment Facial ROM: Reduced right;Suspected CN VII (facial) dysfunction Facial Symmetry: Abnormal symmetry right;Suspected  CN VII (facial) dysfunction Facial Strength: Reduced right;Suspected CN VII (facial) dysfunction Facial Sensation: Within Functional Limits Lingual ROM: Within Functional Limits Lingual Symmetry: Within Functional Limits Lingual Strength: Within Functional Limits Lingual Sensation: Reduced;Suspected CN VII (facial) dysfunction-anterior 2/3 tongue Velum: Within Functional Limits Mandible: Within Functional Limits   Ice Chips Ice chips: Not tested   Thin Liquid Thin Liquid: Within functional limits Presentation: Cup;Self Fed;Straw    Nectar Thick Nectar Thick Liquid: Not tested   Honey Thick Honey Thick Liquid: Not tested   Puree Puree: Within functional limits Presentation: Self Fed;Spoon   Solid     Solid: Impaired Presentation: Self Fed Oral Phase Functional Implications: Right lateral sulci pocketing Pharyngeal Phase Impairments: Cough - Delayed       Osie Bond., M.A. [place] Pager [phone] Office [phone]  08/03/2020,5:00 PM

## 2020-08-03 NOTE — Progress Notes (Signed)
Neurology Progress Note Subjective: No acute overnight events; patient unable to tolerate MRI imaging yesterday, plan to re-attempt this morning Patient remains aphasic and does endorse one episode of transient diplopia during assessment this morning  Exam: Vitals:   08/03/20 0440 08/03/20 0800  BP: 103/67 132/74  Pulse: 69 77  Resp: 18 16  Temp: 97.8 F (36.6 C) 97.6 F (36.4 C)  SpO2: 92% 94%   Gen: Laying in bed, in no acute distress Resp: non-labored breathing, no respiratory distress Abd: soft, non-tender, non-distended  Neuro: Mental Status: Patient is awake, alert, and oriented to self, place, time, month, age, and situation. He is able to provide clear and coherent history of present illness with some limitation due to speech difficulties. Speech remains aphasic. Patient also struggling to write sentences with correct words. Patient is not dysarthric. Patient follows all commands. He is able to name all objects and repeat phrases with 1-2 missed words consistently. No neglect noted on examination.  CN: Pupils are 4 mm, equal, round, briskly reactive to light bilaterally, visual fields are full, EOMI without ptosis but with 2-3 beats of nystagmus noted with leftward gaze, sensation to face is decreased on the left, subtle right mouth drooping noted with smiling, hearing is intact to voice, phonation intact, symmetric palate rise, shoulder shrug equal and symmetric, tongue protrudes midline without fasciculations. Motor: Moves all extremities spontaneously with antigravity movement without pronator drift. Bilateral lower extremities 5/5 strength, left upper extremity 5/5 strength, right upper extremity with 5/5 strength and subtle weakness 4/5 distally at the hand. Tone and bulk are normal.   Sensory: Sensation to light touch intact and symmetric in upper and lower extremities.  DTR: 2+ throughout Gait: Deferred  Pertinent Labs: CBC    Component Value Date/Time   WBC 8.0  08/03/2020 0547   RBC 5.50 08/03/2020 0547   HGB 16.4 08/03/2020 0547   HCT 50.9 08/03/2020 0547   PLT 228 08/03/2020 0547   MCV 92.5 08/03/2020 0547   MCH 29.8 08/03/2020 0547   MCHC 32.2 08/03/2020 0547   RDW 12.5 08/03/2020 0547   LYMPHSABS 0.8 08/03/2020 0547   MONOABS 0.1 08/03/2020 0547   EOSABS 0.0 08/03/2020 0547   BASOSABS 0.0 08/03/2020 0547   CMP     Component Value Date/Time   NA 140 08/03/2020 0547   K 4.5 08/03/2020 0547   CL 108 08/03/2020 0547   CO2 23 08/03/2020 0547   GLUCOSE 151 (H) 08/03/2020 0547   BUN 18 08/03/2020 0547   CREATININE 1.47 (H) 08/03/2020 0547   CALCIUM 9.4 08/03/2020 0547   PROT 6.8 08/03/2020 0547   ALBUMIN 3.7 08/03/2020 0547   AST 19 08/03/2020 0547   ALT 21 08/03/2020 0547   ALKPHOS 37 (L) 08/03/2020 0547   BILITOT 0.7 08/03/2020 0547   GFRNONAA 51 (L) 08/03/2020 0547   Imaging I have reviewed the images obtained: CT-scan of the brain: 1. At least 2 mass lesions identified. The largest is in the posterior left frontal lobe and parietal lobe. Acute hemorrhage is noted in the anterior aspect of this lesion. 2. Second lesion identified within the right parietal lobe. 3. These likely reflect metastatic lesions. 4. No additional acute hemorrhage or focal cortical infarct.  CT chest, abdomen, and pelvis with contrast: No CT findings suspicious for primary malignancy in the chest, abdomen, or pelvis.  Routine EEG 4/19: "IMPRESSION: This study is within normal limits. No seizures or epileptiform discharges were seen throughout the recording."  Assessment: 70 year old male who  presents for evaluation of acute onset aphasia while at breakfast 4/18. Pertinent history includes history of melanoma excision of skin with clean margins x 3 and elevated PSA.  - Examination reveals persistent aphasia. No other neurologic deficits identified. - CT head non-contrast with at least 2 mass lesions with acute hemorrhage noted on the anterior aspect  of the left frontal lesion, concern for metastatic lesions. Further imaging pending for further mass evaluation versus primary site.  Highly concerning for potential metastatic melanoma given his history - Due to cortical involvement of mass lesion and associated acute hemorrhage, Keppra initiated for seizure prophylaxis at this time. Routine EEG complete without seizures or epileptiform discharges seen throughout the recording  Recommendations: - MRI brain with and without contrast to be re-attempted this morning - Appreciate neurosurgical evaluation for tumor - Continue Keppra 500 mg BID PO (or IV if needed 1:1 conversion) - Neurology will be available for further questions or concerns  Patient seen independently by NP. Discussed plan with attending provider and they are in agreement.  Anibal Henderson, AGACNP-BC [facility] [phone]

## 2020-08-03 NOTE — Anesthesia Preprocedure Evaluation (Addendum)
Anesthesia Evaluation  Patient identified by MRN, date of birth, ID band Patient awake    Reviewed: Allergy & Precautions, NPO status , Patient's Chart, lab work & pertinent test results  Airway Mallampati: II  TM Distance: >3 FB Neck ROM: Full    Dental  (+) Dental Advisory Given, Teeth Intact   Pulmonary sleep apnea ,    breath sounds clear to auscultation       Cardiovascular hypertension, Pt. on medications  Rhythm:Regular Rate:Normal     Neuro/Psych negative neurological ROS     GI/Hepatic negative GI ROS, Neg liver ROS,   Endo/Other  negative endocrine ROS  Renal/GU negative Renal ROS     Musculoskeletal   Abdominal Normal abdominal exam  (+)   Bowel sounds: normal.  Peds  Hematology negative hematology ROS (+)   Anesthesia Other Findings   Reproductive/Obstetrics                            Anesthesia Physical Anesthesia Plan  ASA: III  Anesthesia Plan: General   Post-op Pain Management:    Induction: Intravenous  PONV Risk Score and Plan: 2 and Dexamethasone, Ondansetron and Treatment may vary due to age or medical condition  Airway Management Planned: LMA  Additional Equipment:   Intra-op Plan:   Post-operative Plan: Extubation in OR  Informed Consent: I have reviewed the patients History and Physical, chart, labs and discussed the procedure including the risks, benefits and alternatives for the proposed anesthesia with the patient or authorized representative who has indicated his/her understanding and acceptance.     Dental advisory given  Plan Discussed with: CRNA  Anesthesia Plan Comments: (Lab Results      Component                Value               Date                      WBC                      8.0                 08/03/2020                HGB                      16.4                08/03/2020                HCT                      50.9                 08/03/2020                MCV                      92.5                08/03/2020                PLT                      228  08/03/2020           Lab Results      Component                Value               Date                      NA                       140                 08/03/2020                K                        4.5                 08/03/2020                CO2                      23                  08/03/2020                GLUCOSE                  151 (H)             08/03/2020                BUN                      18                  08/03/2020                CREATININE               1.47 (H)            08/03/2020                CALCIUM                  9.4                 08/03/2020                GFRNONAA                 51 (L)              08/03/2020          )       Anesthesia Quick Evaluation

## 2020-08-03 NOTE — Evaluation (Signed)
Speech Language Pathology Evaluation Patient Details Name: Bryan Hobbs MRN: [id number] DOB: 21-Nov-1950 Today's Date: 08/03/2020 Time: 3825-0539 SLP Time Calculation (min) (ACUTE ONLY): 30 min  Problem List:  Patient Active Problem List   Diagnosis Date Noted  . Aphasia   . Elevated PSA   . Brain mass 08/02/2020   Past Medical History:  Past Medical History:  Diagnosis Date  . Hepatitis A   . Hypertension   . Melanoma ([place])   . Pre-diabetes   . Sleep apnea    Past Surgical History:  Past Surgical History:  Procedure Laterality Date  . APPENDECTOMY  2017  . CYSTOSCOPY W/ URETERAL STENT PLACEMENT    . HERNIA REPAIR    . [place], 2010, 2018   HPI:  Pt is a 70 yo male presenting with aphasia and slurred speech. CT Head showed a 3.4 x 2.6 x 2.8 cm lesion in the left posterior frontal and parietal lobe with acute hemorrhage, and another 1.4 cm lesion in the posterior right parietal lobe. Concern is for metastatic lesions, with additional w/u underway. PMH includes: HTN, melanoma x3 s/p surgery, recent dental work (~3 weeks PTA, after which mild changes in his speech may have been noted)   Assessment / Plan / Recommendation Clinical Impression  Pt presents with primarily expressive language difficulties, with receptive language and cognitive skills relatively intact for tasks assessed. His articulation is mildly impaired, but intelligible at the sentence level. His expressive communciation is marked by phonemic and semantic paraphasias and at times perseveration on an incorrect word, but he demonstrates good awareness of most of these errors. Pt has a background in linguistics, and has actually been analyzing his linguistic patterns to try to facilitate more accurate language production. He is combining this with speech intelligibility strategies, such as pausing between words and over articulating, to try to speak even more clearly. He has developed a series of  gestures with his husband that help him navigate through times of anomia, benefiting from a model that he can then repeat. Pt would benefit from ongoing SLP f/u to maximize communication skills further.    SLP Assessment  SLP Recommendation/Assessment: Patient needs continued Speech Lanaguage Pathology Services SLP Visit Diagnosis: Aphasia (R47.01)    Follow Up Recommendations   (tba)    Frequency and Duration min 2x/week  2 weeks      SLP Evaluation Cognition  Overall Cognitive Status: Within Functional Limits for tasks assessed       Comprehension  Auditory Comprehension Overall Auditory Comprehension: Appears within functional limits for tasks assessed    Expression Expression Primary Mode of Expression: Verbal Verbal Expression Overall Verbal Expression: Impaired Initiation: No impairment Automatic Speech: Name;Social Response Level of Generative/Spontaneous Verbalization: Sentence Repetition: No impairment (at the word level) Naming: Impairment Verbal Errors: Semantic paraphasias;Phonemic paraphasias;Other (comment) (mostly aware of errors - and trying to compensate) Pragmatics: No impairment Non-Verbal Means of Communication: Gestures   Oral / Motor  Oral Motor/Sensory Function Overall Oral Motor/Sensory Function: Mild impairment Facial ROM: Reduced right;Suspected CN VII (facial) dysfunction Facial Symmetry: Abnormal symmetry right;Suspected CN VII (facial) dysfunction Facial Strength: Reduced right;Suspected CN VII (facial) dysfunction Facial Sensation: Within Functional Limits Lingual ROM: Within Functional Limits Lingual Symmetry: Within Functional Limits Lingual Strength: Within Functional Limits Lingual Sensation: Reduced;Suspected CN VII (facial) dysfunction-anterior 2/3 tongue Velum: Within Functional Limits Mandible: Within Functional Limits Motor Speech Overall Motor Speech: Impaired Respiration: Within functional limits Phonation: Normal Resonance:  Within functional limits Articulation: Impaired Level of Impairment:  Sentence Intelligibility: Intelligible Effective Techniques: Slow rate;Over-articulate;Pause   GO                     Osie Bond., M.A. [place] [facility] Pager [phone] Office [phone]  08/03/2020, 5:26 PM

## 2020-08-03 NOTE — Plan of Care (Signed)
  Problem: Education: Goal: Knowledge of disease or condition will improve Outcome: Progressing   Problem: Coping: Goal: Will verbalize positive feelings about self Outcome: Progressing   Problem: Nutrition: Goal: Risk of aspiration will decrease Outcome: Not Progressing   Pt fail bedside swallow test with thin liquids. Pt coughed and unable to swallow.

## 2020-08-03 NOTE — Procedures (Signed)
Patient Name: Bryan Hobbs  MRN: [id number]  Epilepsy Attending: Lora Havens  Referring Physician/Provider: Dr Lesleigh Noe Date: 08/03/2020  Duration: 23 mins  Patient history: 70 year old male with acute onset aphasia.  EEG to evaluate for seizures.  Level of alertness: Awake, asleep  AEDs during EEG study: Keppra  Technical aspects: This EEG study was done with scalp electrodes positioned according to the 10-20 International system of electrode placement. Electrical activity was acquired at a sampling rate of 500Hz  and reviewed with a high frequency filter of 70Hz  and a low frequency filter of 1Hz . EEG data were recorded continuously and digitally stored.   Description: The posterior dominant rhythm consists of 9-10 Hz activity of moderate voltage (25-35 uV) seen predominantly in posterior head regions, symmetric and reactive to eye opening and eye closing. Sleep was characterized by vertex waves, sleep spindles (12 to 14 Hz), maximal frontocentral region. Hyperventilation and photic stimulation were not performed.     IMPRESSION: This study is within normal limits. No seizures or epileptiform discharges were seen throughout the recording.  Venera Privott Barbra Sarks

## 2020-08-04 ENCOUNTER — Encounter (HOSPITAL_COMMUNITY)
Admission: EM | Disposition: A | Discharge status: Home / Self Care | Payer: Self-pay | Source: Home / Self Care | Attending: Internal Medicine

## 2020-08-04 ENCOUNTER — Inpatient Hospital Stay (HOSPITAL_COMMUNITY): Payer: PRIVATE HEALTH INSURANCE | Admitting: Anesthesiology

## 2020-08-04 ENCOUNTER — Inpatient Hospital Stay (HOSPITAL_COMMUNITY): Payer: PRIVATE HEALTH INSURANCE

## 2020-08-04 ENCOUNTER — Encounter (HOSPITAL_COMMUNITY): Payer: Self-pay | Admitting: Internal Medicine

## 2020-08-04 DIAGNOSIS — R4701 Aphasia: Secondary | ICD-10-CM | POA: Diagnosis not present

## 2020-08-04 DIAGNOSIS — G939 Disorder of brain, unspecified: Secondary | ICD-10-CM | POA: Diagnosis not present

## 2020-08-04 HISTORY — PX: RADIOLOGY WITH ANESTHESIA: SHX6223

## 2020-08-04 LAB — GLUCOSE, CAPILLARY: Glucose-Capillary: 151 mg/dL — ABNORMAL HIGH (ref 70–99)

## 2020-08-04 IMAGING — MR MR HEAD WO/W CM
14 of 15 series · 30 of 48 positions shown · IV contrast (gadavist)
Comparison: None.

CLINICAL DATA: Stroke follow-up.

EXAM:
MRI HEAD WITHOUT AND WITH CONTRAST
TECHNIQUE: Multiplanar, multiecho pulse sequences of the brain and surrounding
structures were obtained without and with intravenous contrast.
CONTRAST:  9mL GADAVIST GADOBUTROL 1 MMOL/ML IV SOLN

[Series 2: FLAIR · sagittal · 3.0mm · 0.47mm/px · 1 of 39 slices shown (1 of 2)]
[im 1/39]
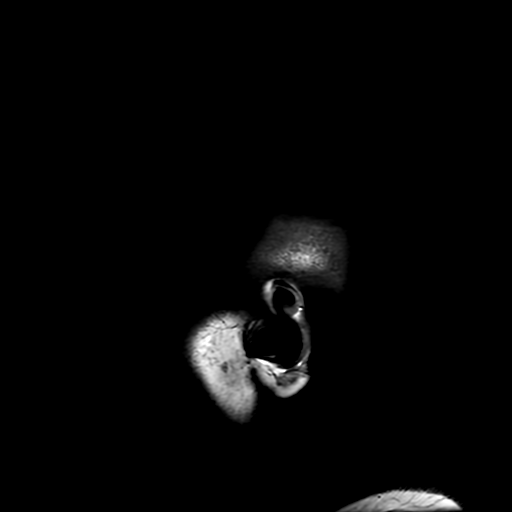

[Series 3: T2 · axial · 5.0mm · 0.43mm/px · 1 of 28 slices shown (1 of 2)]
[im 1/28]
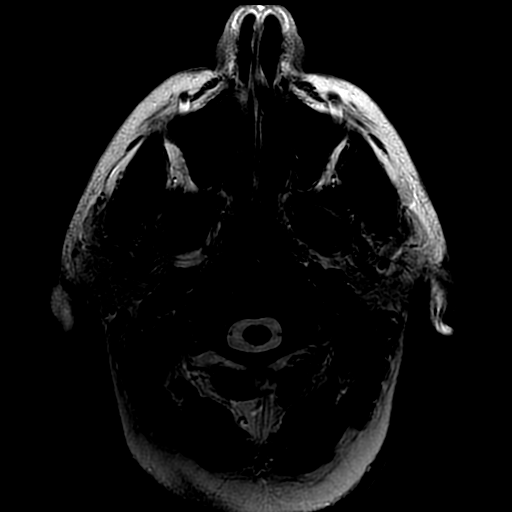

[Series 4: ax dti · axial · 3.0mm · 0.94mm/px · z∈[+11,+141]mm · 8 of 1352 slices shown]
[im 72/1352]
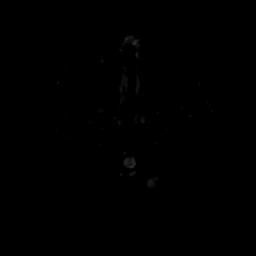
[im 214/1352]
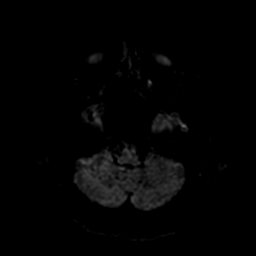
[im 427/1352]
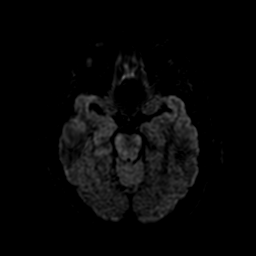
[im 569/1352]
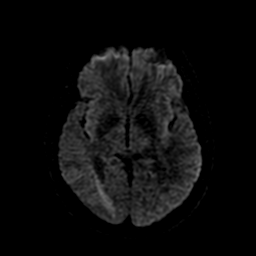
[im 783/1352]
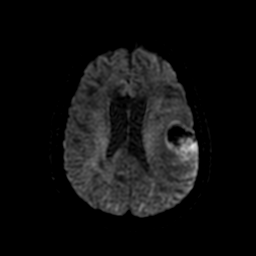
[im 925/1352]
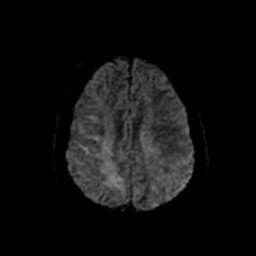
[im 1138/1352]
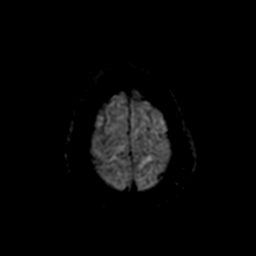
[im 1280/1352]
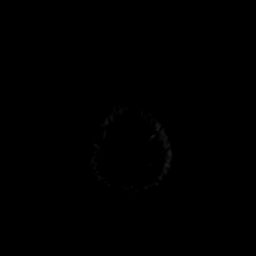

[Series 6: (person_name) · axial · 3.0mm · 0.47mm/px · 1 of 100 slices shown]
[im 1/100]
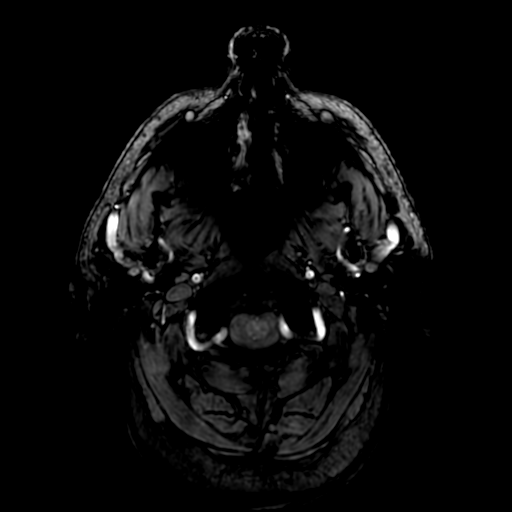

[Series 9: T2 · coronal · 3.0mm · 0.39mm/px · 1 of 60 slices shown (2 of 2)]
[im 1/60]
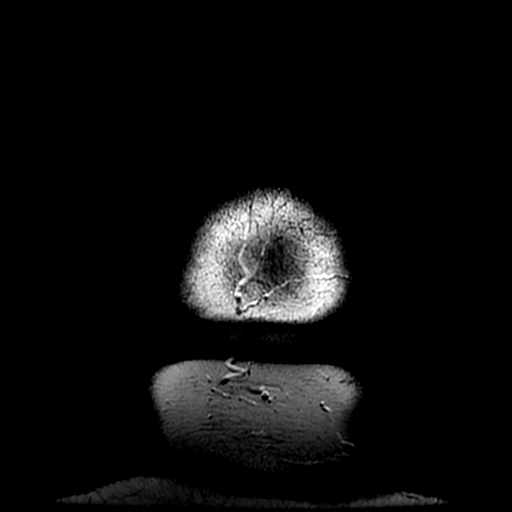

[Series 10: ax 3(person_name) · axial · 1.0mm · 1.02mm/px · z∈[-82,+107]mm · 3 of 190 slices shown (1 of 2)]
[im 1/190]
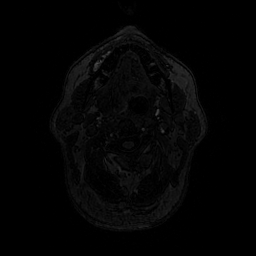
[im 95/190]
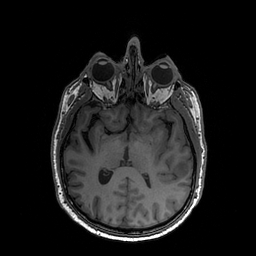
[im 190/190]
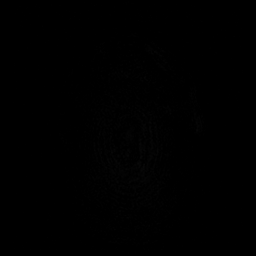

[Series 24: ax 3(person_name) · axial · 1.0mm · 1.02mm/px · z∈[-82,+107]mm · 3 of 190 slices shown (2 of 2)]
[im 1/190]
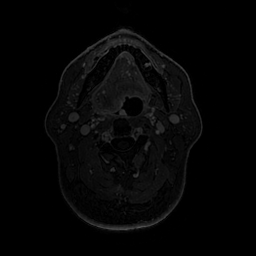
[im 95/190]
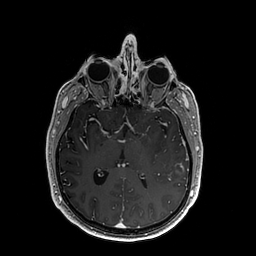
[im 190/190]
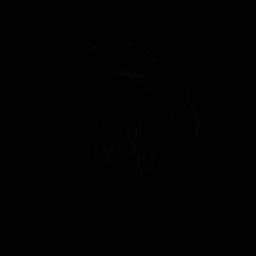

[Series 25: T1 · coronal · 5.0mm · 0.43mm/px · 1 of 30 slices shown]
[im 1/30]
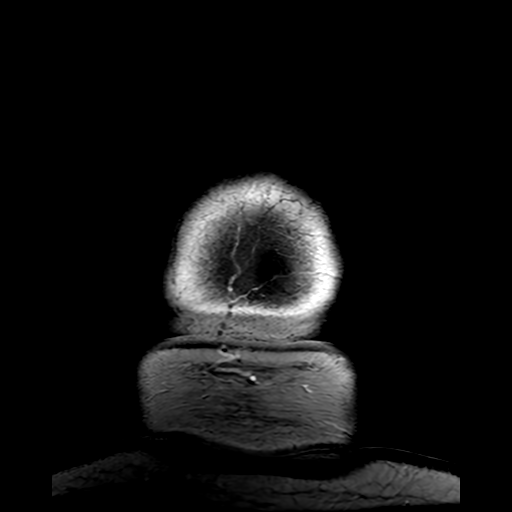

[Series 26: FLAIR · sagittal · 3.0mm · 0.47mm/px · 1 of 39 slices shown (2 of 2)]
[im 1/39]
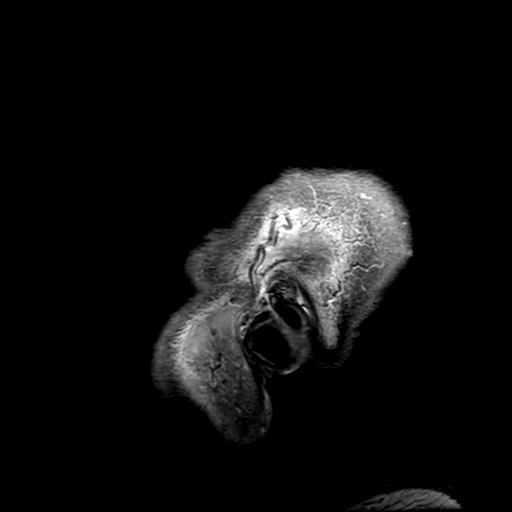

[Series 450: trace · axial · 3.0mm · 0.94mm/px · 1 of 52 slices shown]
[im 1/52]
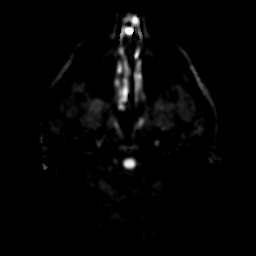

[Series 451: fa(no-q) · axial · 3.0mm · 0.94mm/px · 1 of 49 slices shown]
[im 1/49]
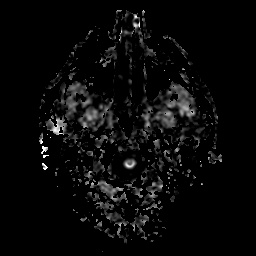

[Series 452: avdc (10^-6 mm²/s)(no-q) · axial · 3.0mm · 0.94mm/px · 1 of 52 slices shown]
[im 1/52]
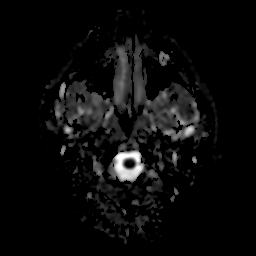

[Series 500: multiplanar reconstruction (mpr) · axial · 1.0mm · 0.50mm/px · z∈[-125,+131]mm · 4 of 257 slices shown (1 of 2)]
[im 1/257]
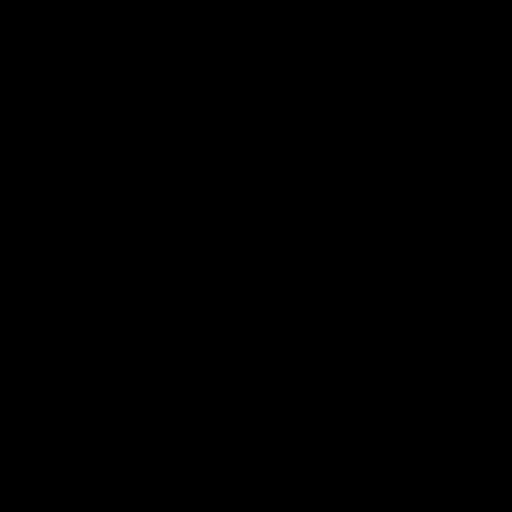
[im 86/257]
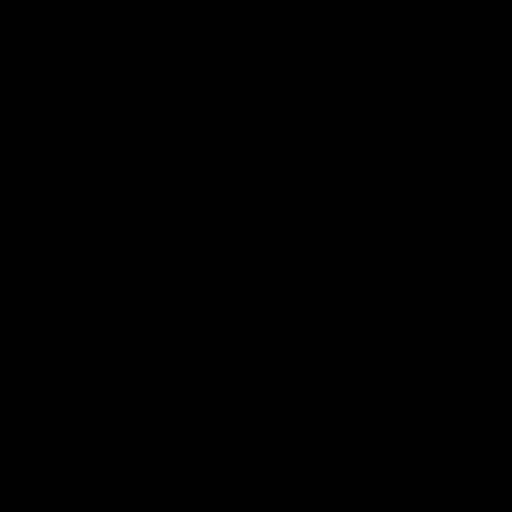
[im 171/257]
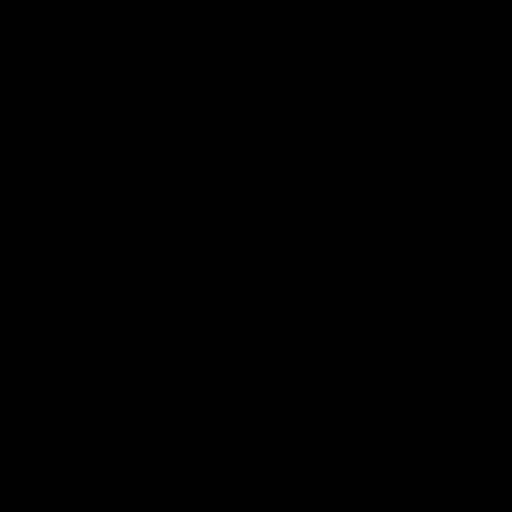
[im 257/257]
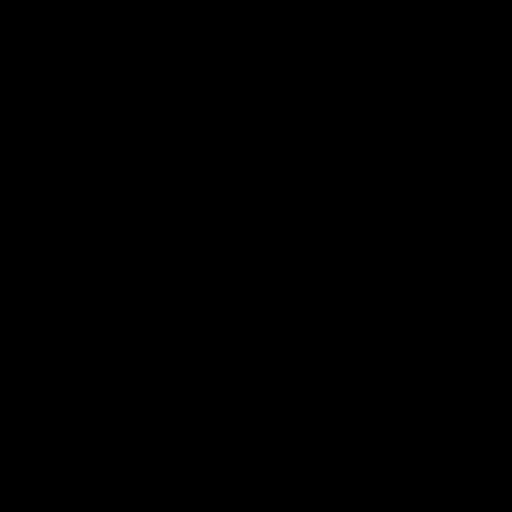

[Series 501: multiplanar reconstruction (mpr) · coronal · 1.0mm · 0.50mm/px · 3 of 244 slices shown (2 of 2)]
[im 1/244]
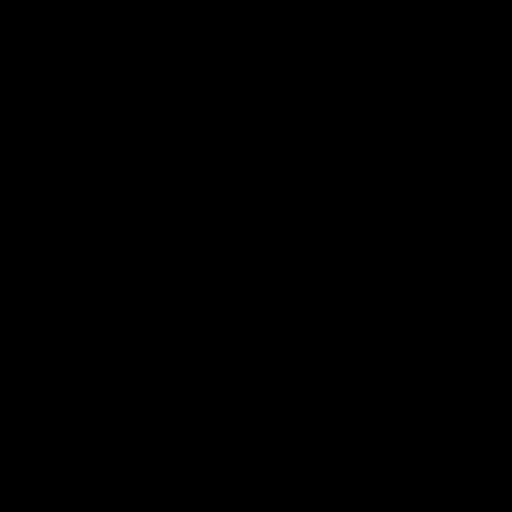
[im 82/244]
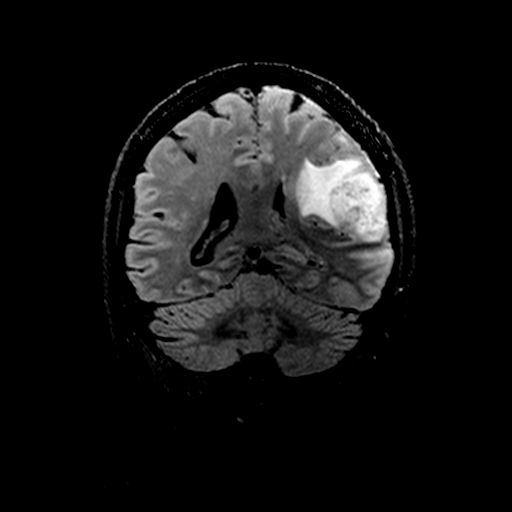
[im 163/244]
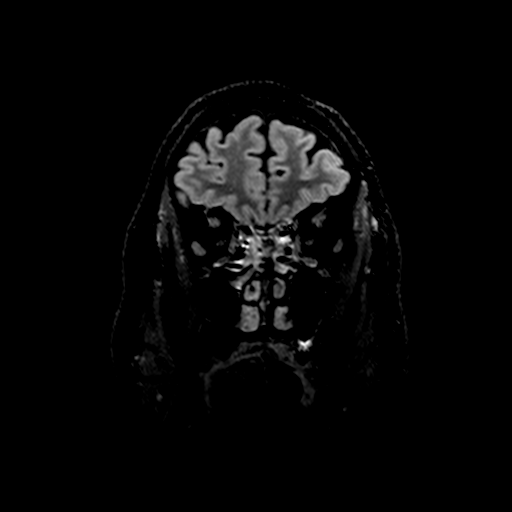

[30 of 48 positions shown; findings below may reference images not displayed]

FINDINGS: Brain: Approximately 3.4 by 3.1 by 3.4 cm hemorrhagic mass in the
posterior left frontal lobe with areas of internal T2 hypointensity,
there a genius T1 hyperintensity and susceptibility artifact. There
is irregular and wispy peripheral enhancement and restricted
diffusion, compatible with hypercellularity. The mass abuts the
overlying left frontal dura. dditional 1.5 by 0.6 by 0.9 cm
enhancing mass within the right parietal lobe, which also
demonstrates some faint peripheral restricted diffusion, compatible
with hypercellularity. Both lesions demonstrate exuberant
surrounding vasogenic edema. Resulting mass effect on the left
lateral ventricle which is partially effaced. Additionally, there is
approximately 3 mm of rightward midline shift at the foramen of
DE BOER. Small focus of nonenhancing left frontal periventricular
restricted diffusion (series 450, image 33). Additional scattered
mild T2/FLAIR hyperintensities within the white matter, nonspecific
but likely related to chronic microvascular ischemic disease. No
hydrocephalus.

Vascular: Major arterial flow voids are maintained at the skull
base.

Skull and upper cervical spine: Normal marrow signal.

Sinuses/Orbits: Mucosal thickening of ethmoid air cells, frontal
sinuses and sphenoid sinuses. Unremarkable orbits.

Other: Heterogeneous T1 hyperintensity involving the left
nasopharynx. No mastoid effusions.
IMPRESSION: 1. Large hemorrhagic and peripherally enhancing heterogeneous mass
in the left posterior frontal lobe and additional enhancing mass in
the right parietal lobe, as detailed above. Given multifocality,
findings are concerning for metastatic disease. Multicentric
high-grade glioma is a less likely differential consideration.
2. Exuberant surrounding edema. Resulting mass effect with partial
left lateral ventricular effacement and 3 mm of rightward midline
shift.
3. Small focus of nonenhancing left frontal periventricular
restricted diffusion, which could represent acute infarct or an
additional nonenhancing lesion. Recommend attention on close
follow-up.
4. Heterogeneous T1 hyperintensity involving the left nasopharynx,
which is indeterminate but potentially represents proteinaceous
retention cysts. This area is amenable to direct inspection.

## 2020-08-04 IMAGING — MR MR PROSTATE WO/W CM
52 of 56 series · 52 of 56 positions shown · IV contrast (gadavist)
Comparison: None.

CLINICAL DATA: Enlarged prostate with elevated PSA by report.

EXAM:
MR PROSTATE WITHOUT AND WITH CONTRAST
TECHNIQUE: Multiplanar multisequence MRI images were obtained of the pelvis
centered about the prostate. Pre and post contrast images were
obtained.
CONTRAST:  9mL GADAVIST GADOBUTROL 1 MMOL/ML IV SOLN

[Series 14: T1 · axial · 8.0mm · 0.78mm/px · 1 of 28 slices shown (1 of 2)]
[im 1/28]
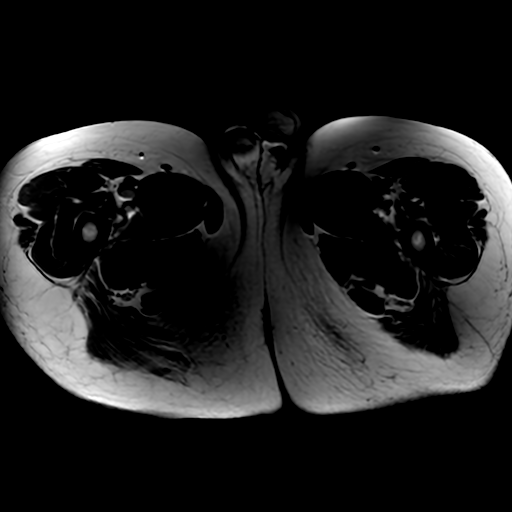

[Series 15: bSSFP fat-sat · axial · 8.0mm · 0.78mm/px · 1 of 28 slices shown]
[im 1/28]
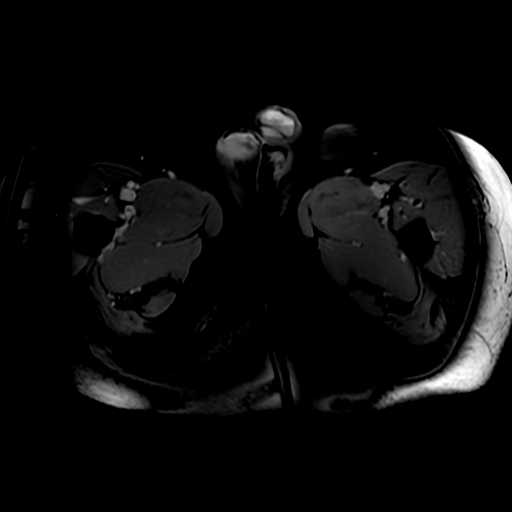

[Series 16: T2 · sagittal · 3.5mm · 0.62mm/px · 1 of 29 slices shown (1 of 3)]
[im 1/29]
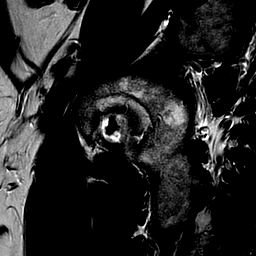

[Series 17: T1 · axial · 3.0mm · 0.31mm/px · 1 of 25 slices shown (2 of 2)]
[im 1/25]
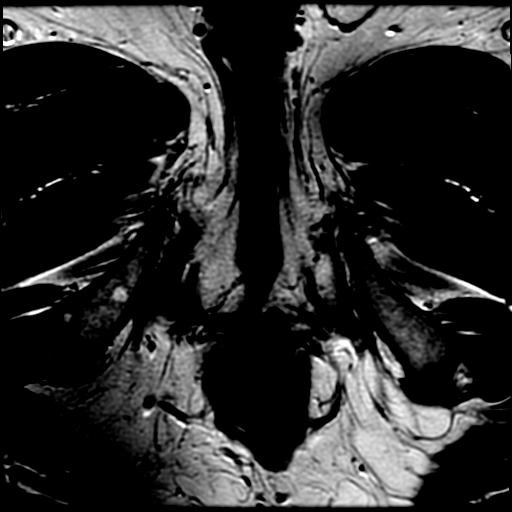

[Series 18: T2 · axial · 3.0mm · 0.62mm/px · 1 of 28 slices shown (2 of 3)]
[im 1/28]
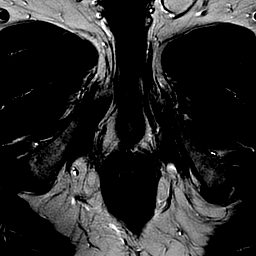

[Series 20: DWI · axial · 3.0mm · 0.70mm/px · 1 of 86 slices shown]
[im 1/86]
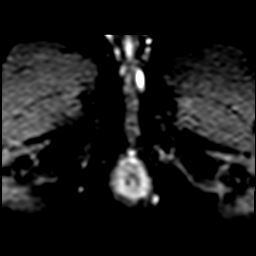

[Series 21: T2 · coronal · 3.0mm · 0.62mm/px · 1 of 24 slices shown (3 of 3)]
[im 1/24]
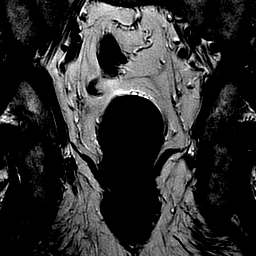

[Series 2050: ADC · axial · 3.0mm · 0.70mm/px · 1 of 29 slices shown]
[im 1/29]
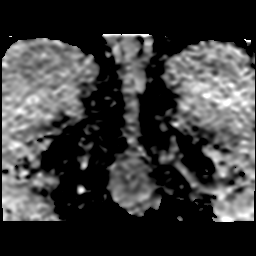

[Series 2300: T1 dynamic · axial · 4.0mm · 0.94mm/px · 1 of 26 slices shown (1 of 24)]
[im 1/26]
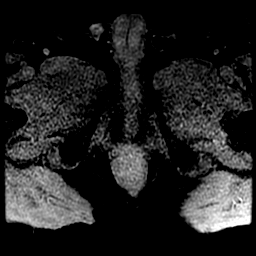

[Series 2301: T1 dynamic · axial · 4.0mm · 0.94mm/px · 1 of 26 slices shown (2 of 24)]
[im 1/26]
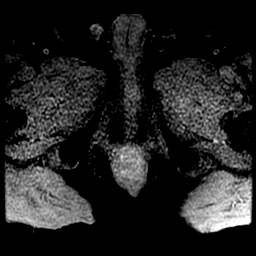

[Series 2302: T1 dynamic · axial · 4.0mm · 0.94mm/px · 1 of 26 slices shown (3 of 24)]
[im 1/26]
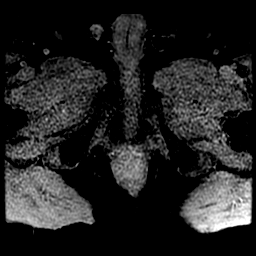

[Series 2303: T1 dynamic · axial · 4.0mm · 0.94mm/px · 1 of 26 slices shown (4 of 24)]
[im 1/26]
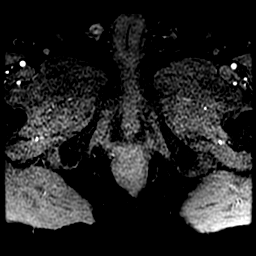

[Series 2304: T1 dynamic · axial · 4.0mm · 0.94mm/px · 1 of 26 slices shown (5 of 24)]
[im 1/26]
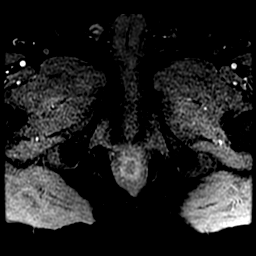

[Series 2305: T1 dynamic · axial · 4.0mm · 0.94mm/px · 1 of 26 slices shown (6 of 24)]
[im 1/26]
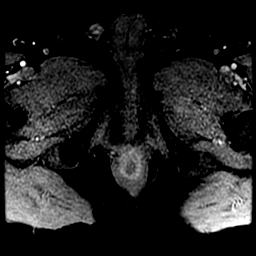

[Series 2306: T1 dynamic · axial · 4.0mm · 0.94mm/px · 1 of 26 slices shown (7 of 24)]
[im 1/26]
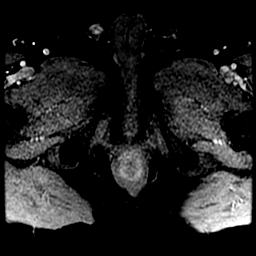

[Series 2307: T1 dynamic · axial · 4.0mm · 0.94mm/px · 1 of 26 slices shown (8 of 24)]
[im 1/26]
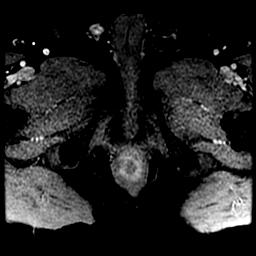

[Series 2308: T1 dynamic · axial · 4.0mm · 0.94mm/px · 1 of 26 slices shown (9 of 24)]
[im 1/26]
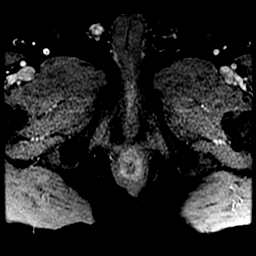

[Series 2309: T1 dynamic · axial · 4.0mm · 0.94mm/px · 1 of 26 slices shown (10 of 24)]
[im 1/26]
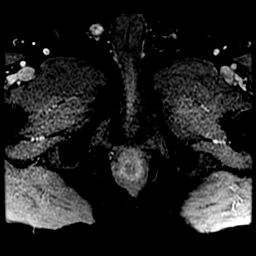

[Series 2310: T1 dynamic · axial · 4.0mm · 0.94mm/px · 1 of 26 slices shown (11 of 24)]
[im 1/26]
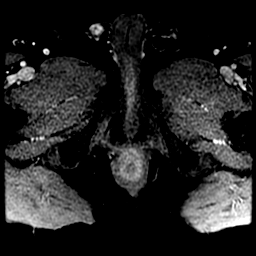

[Series 2311: T1 dynamic · axial · 4.0mm · 0.94mm/px · 1 of 26 slices shown (12 of 24)]
[im 1/26]
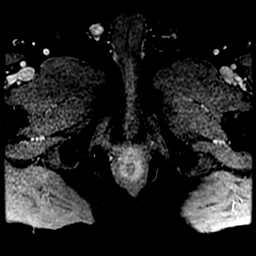

[Series 2312: T1 dynamic · axial · 4.0mm · 0.94mm/px · 1 of 26 slices shown (13 of 24)]
[im 1/26]
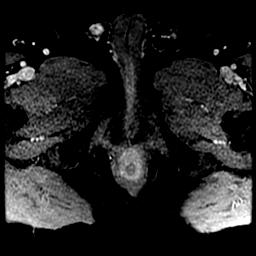

[Series 2313: T1 dynamic · axial · 4.0mm · 0.94mm/px · 1 of 26 slices shown (14 of 24)]
[im 1/26]
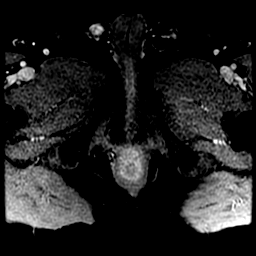

[Series 2314: T1 dynamic · axial · 4.0mm · 0.94mm/px · 1 of 26 slices shown (15 of 24)]
[im 1/26]
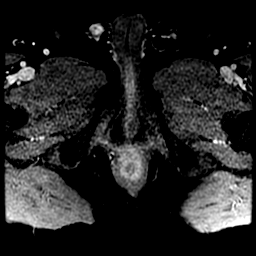

[Series 2315: T1 dynamic · axial · 4.0mm · 0.94mm/px · 1 of 26 slices shown (16 of 24)]
[im 1/26]
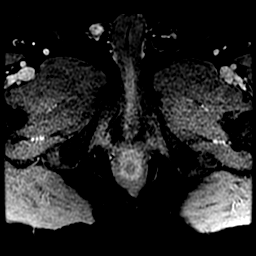

[Series 2316: T1 dynamic · axial · 4.0mm · 0.94mm/px · 1 of 26 slices shown (17 of 24)]
[im 1/26]
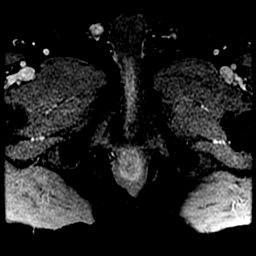

[Series 2317: T1 dynamic · axial · 4.0mm · 0.94mm/px · 1 of 26 slices shown (18 of 24)]
[im 1/26]
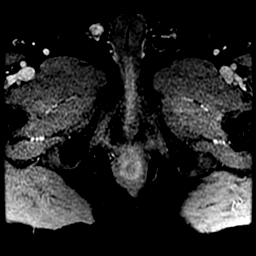

[Series 2318: T1 dynamic · axial · 4.0mm · 0.94mm/px · 1 of 26 slices shown (19 of 24)]
[im 1/26]
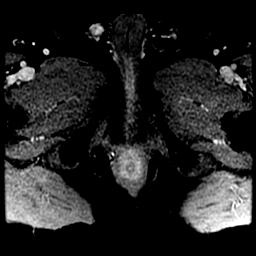

[Series 2319: T1 dynamic · axial · 4.0mm · 0.94mm/px · 1 of 26 slices shown (20 of 24)]
[im 1/26]
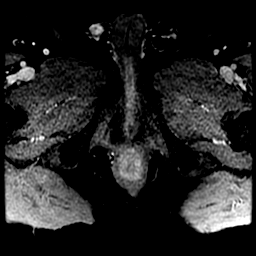

[Series 2320: T1 dynamic · axial · 4.0mm · 0.94mm/px · 1 of 26 slices shown (21 of 24)]
[im 1/26]
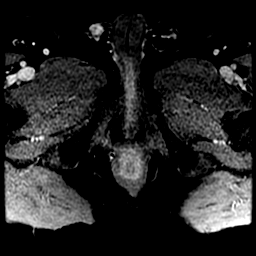

[Series 2321: T1 dynamic · axial · 4.0mm · 0.94mm/px · 1 of 26 slices shown (22 of 24)]
[im 1/26]
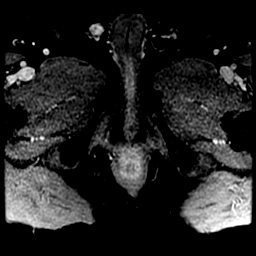

[Series 2322: T1 dynamic · axial · 4.0mm · 0.94mm/px · 1 of 26 slices shown (23 of 24)]
[im 1/26]
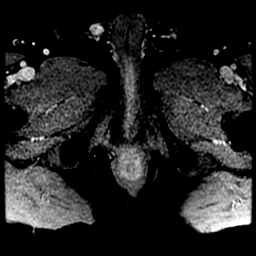

[Series 2323: T1 dynamic · axial · 4.0mm · 0.94mm/px · 1 of 26 slices shown (24 of 24)]
[im 1/26]
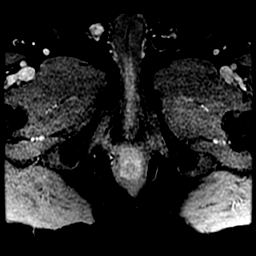

[((date))-((date)) · axial · 4.0mm · 0.94mm/px · 1 of 1 slices shown (1 of 20)]
[im 1/1]
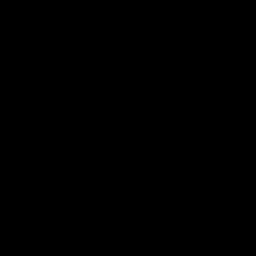

[((date))-((date)) · axial · 4.0mm · 0.94mm/px · 1 of 4 slices shown (2 of 20)]
[im 1/4]
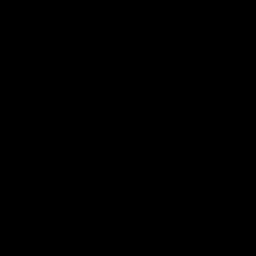

[((date))-((date)) · axial · 4.0mm · 0.94mm/px · 1 of 15 slices shown (3 of 20)]
[im 1/15]
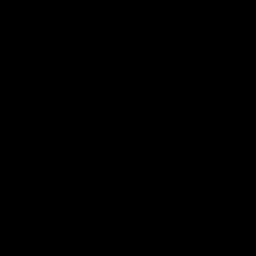

[((date))-((date)) · axial · 4.0mm · 0.94mm/px · 1 of 26 slices shown (4 of 20)]
[im 1/26]
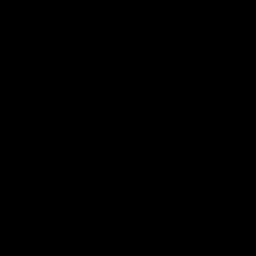

[((date))-((date)) · axial · 4.0mm · 0.94mm/px · 1 of 26 slices shown (5 of 20)]
[im 1/26]
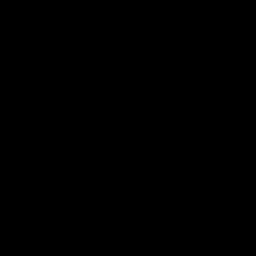

[((date))-((date)) · axial · 4.0mm · 0.94mm/px · 1 of 26 slices shown (6 of 20)]
[im 1/26]
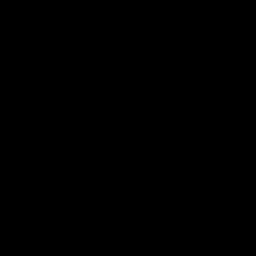

[((date))-((date)) · axial · 4.0mm · 0.94mm/px · 1 of 26 slices shown (7 of 20)]
[im 1/26]
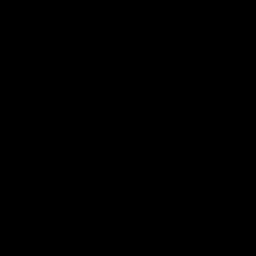

[((date))-((date)) · axial · 4.0mm · 0.94mm/px · 1 of 26 slices shown (8 of 20)]
[im 1/26]
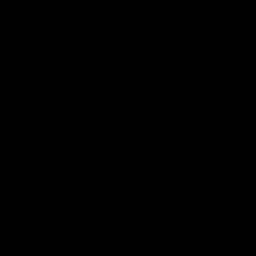

[((date))-((date)) · axial · 4.0mm · 0.94mm/px · 1 of 26 slices shown (9 of 20)]
[im 1/26]
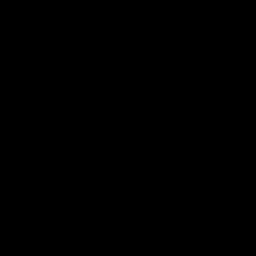

[((date))-((date)) · axial · 4.0mm · 0.94mm/px · 1 of 26 slices shown (10 of 20)]
[im 1/26]
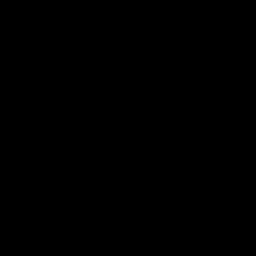

[((date))-((date)) · axial · 4.0mm · 0.94mm/px · 1 of 26 slices shown (11 of 20)]
[im 1/26]
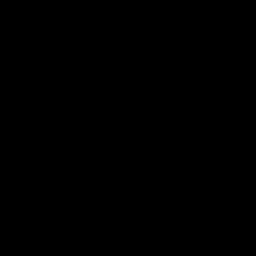

[((date))-((date)) · axial · 4.0mm · 0.94mm/px · 1 of 26 slices shown (12 of 20)]
[im 1/26]
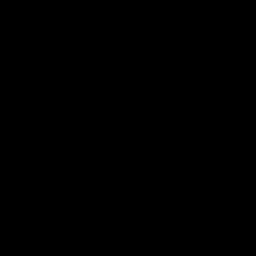

[((date))-((date)) · axial · 4.0mm · 0.94mm/px · 1 of 26 slices shown (13 of 20)]
[im 1/26]
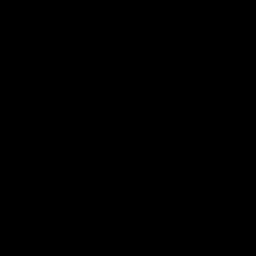

[((date))-((date)) · axial · 4.0mm · 0.94mm/px · 1 of 26 slices shown (14 of 20)]
[im 1/26]
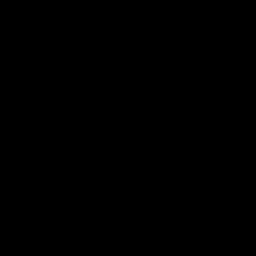

[((date))-((date)) · axial · 4.0mm · 0.94mm/px · 1 of 26 slices shown (15 of 20)]
[im 1/26]
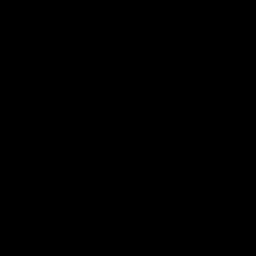

[((date))-((date)) · axial · 4.0mm · 0.94mm/px · 1 of 26 slices shown (16 of 20)]
[im 1/26]
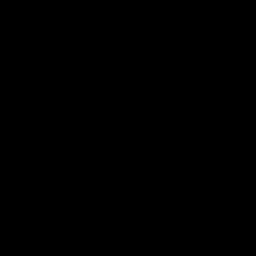

[((date))-((date)) · axial · 4.0mm · 0.94mm/px · 1 of 26 slices shown (17 of 20)]
[im 1/26]
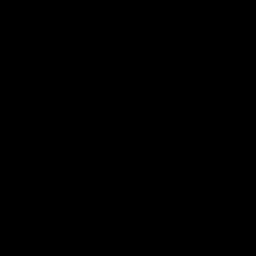

[((date))-((date)) · axial · 4.0mm · 0.94mm/px · 1 of 26 slices shown (18 of 20)]
[im 1/26]
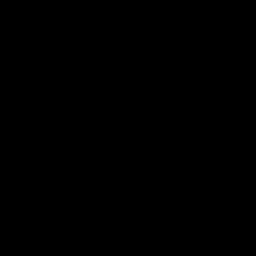

[((date))-((date)) · axial · 4.0mm · 0.94mm/px · 1 of 26 slices shown (19 of 20)]
[im 1/26]
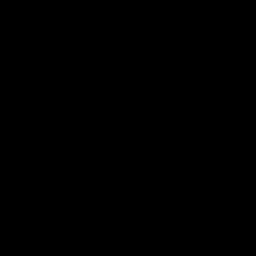

[((date))-((date)) · axial · 4.0mm · 0.94mm/px · 1 of 26 slices shown (20 of 20)]
[im 1/26]
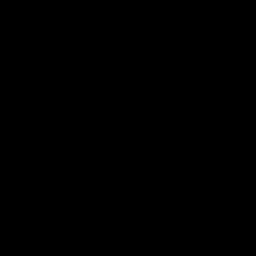

[52 of 56 positions shown; findings below may reference images not displayed]

FINDINGS: Prostate:

Transitional zone:

(Image 15, series 18) homogeneous low T2 signal with lenticular
appearance along the surgical capsule of the RIGHT posterolateral
transitional zone near the base to mid of the RIGHT prostate gland
showing very dark appearance on the ADC map and bright appearance on
diffusion-weighted imaging PIRADS category 5

Signs of BPH elsewhere but with other areas of restricted diffusion
most notably in the LEFT anterior mid gland. This does not
correspond to a homogeneous area of low T2 signal and is therefore
classified as PIRADS category 3 is that also is contained within a
BPH nodule (image 13 of the ADC map). LEFT mid gland anterior
transitional zone

Peripheral zone: Linear and wedge-shaped areas of T2 hypointensity
throughout the peripheral zone compatible with prior prostatitis.

No signs of post biopsy hemorrhage.

Volume: 51.3 cc

Transcapsular spread:  Absent

Seminal vesicle involvement: Absent

Neurovascular bundle involvement: Absent

Pelvic adenopathy: Absent

Bone metastasis: Absent

Other findings: None
IMPRESSION: 1. PIRADS category 5 lesion in the RIGHT posterolateral transitional
zone near the base to mid of the RIGHT prostate gland. Highly
suspicious for prostate neoplasm.
2. PIRADS category 3 lesion in the LEFT anterior mid gland
transitional zone.
3. Findings of prior prostatitis.
4. Signs of BPH.

## 2020-08-04 SURGERY — MRI WITH ANESTHESIA
Anesthesia: General

## 2020-08-04 MED ORDER — PROMETHAZINE HCL 25 MG/ML IJ SOLN
6.2500 mg | INTRAMUSCULAR | Status: DC | PRN
Start: 2020-08-04 — End: 2020-08-04

## 2020-08-04 MED ORDER — ACETAMINOPHEN 10 MG/ML IV SOLN: 1000.0000 mg | Freq: Once | INTRAVENOUS | Status: DC | PRN

## 2020-08-04 MED ORDER — FLUCONAZOLE 150 MG PO TABS
150.0000 mg | ORAL_TABLET | Freq: Once | ORAL | Status: AC
  Administered 2020-08-04: 150 mg via ORAL
  Filled 2020-08-04: qty 1

## 2020-08-04 MED ORDER — LACTATED RINGERS IV SOLN: INTRAVENOUS | Status: DC | PRN

## 2020-08-04 MED ORDER — GADOBUTROL 1 MMOL/ML IV SOLN
9.0000 mL | Freq: Once | INTRAVENOUS | Status: AC | PRN
  Administered 2020-08-04: 9 mL via INTRAVENOUS

## 2020-08-04 MED ORDER — FENTANYL CITRATE (PF) 100 MCG/2ML IJ SOLN: 25.0000 ug | INTRAMUSCULAR | Status: DC | PRN

## 2020-08-04 MED ORDER — PROPOFOL 10 MG/ML IV BOLUS
INTRAVENOUS | Status: DC | PRN
  Administered 2020-08-04: 160 mg via INTRAVENOUS

## 2020-08-04 MED ORDER — MIDAZOLAM HCL 5 MG/5ML IJ SOLN
INTRAMUSCULAR | Status: DC | PRN
  Administered 2020-08-04: 2 mg via INTRAVENOUS

## 2020-08-04 MED ORDER — LIDOCAINE 2% (20 MG/ML) 5 ML SYRINGE
INTRAMUSCULAR | Status: DC | PRN
  Administered 2020-08-04: 60 mg via INTRAVENOUS

## 2020-08-04 NOTE — Progress Notes (Signed)
Noted referral regarding request for TOC to stop by to see pt- RNCM stopped by room to introduce self and role. Pt and husband are at bedside discussing how much care to get here vs. how much after returning to [place]. They are leaning on getting biopsy here at least and then maybe f/u with oncology and neurosurgery in [place]. Mentioned [facility] is in network for his insurance. They are waiting to speak with some others regarding options, and asked if this writer could return tomorrow. Have given them some info on how records could be shared with providers in [place], etc. - will plan to return tomorrow late morning before lunchtime to offer any further assistance and answer questions.

## 2020-08-04 NOTE — Transfer of Care (Signed)
Immediate Anesthesia Transfer of Care Note  Patient: Bryan Hobbs  Procedure(s) Performed: MRI WITH ANESTHESIA (N/A )  Patient Location: PACU  Anesthesia Type:General  Level of Consciousness: awake, alert  and oriented  Airway & Oxygen Therapy: Patient Spontanous Breathing  Post-op Assessment: Report given to RN, Post -op Vital signs reviewed and stable and Patient moving all extremities X 4  Post vital signs: Reviewed and stable  Last Vitals:  Vitals Value Taken Time  BP 120/74 08/04/20 1125  Temp 36.6 C 08/04/20 1115  Pulse 73 08/04/20 1125  Resp 14 08/04/20 1125  SpO2 89 % 08/04/20 1125  Vitals shown include unvalidated device data.  Last Pain:  Vitals:   08/04/20 1115  TempSrc:   PainSc: 0-No pain         Complications: No complications documented.

## 2020-08-04 NOTE — Progress Notes (Addendum)
   Subjective:   Patient reports increased right arm weakness and right lower extremity weakness compared to yesterday.  Speech is moderately improved.  Having a mild headache since anesthesia.  Otherwise recovering well from anesthesia.  We discussed at length how much of his care he wants to receive here versus returning back to [place].  They do not have any particular connections here in [place].  He wants to at least receive a biopsy here.  Patient and husband will discuss this.  Objective:  Vital signs in last 24 hours: Vitals:   08/03/20 1732 08/03/20 1945 08/03/20 2308 08/04/20 0300  BP: (!) 120/91 123/81 116/69 94/69  Pulse: 98 88 69 71  Resp: 17 (!) 22 20 19   Temp: 97.7 F (36.5 C) 98.6 F (37 C) 98.4 F (36.9 C) 98.4 F (36.9 C)  TempSrc: Oral Oral Oral Oral  SpO2: 94% 91% 93% 90%  Weight:      Height:        Physical Exam Constitutional: no acute distress Head: atraumatic ENT: external ears normal Eyes: EOMI Cardiovascular: regular rate and rhythm, normal heart sounds Pulmonary: effort normal, lungs clear to ascultation bilaterally Abdominal: flat Skin: warm and dry, thorough skin exam today with no lesions suspicious for primary melanoma but is noted to have numerous diffuse nevi, actinic keratosis, seborrheic keratosis, and hemangiomas Genitourinary: Intertrigo of inguinal folds with satellite lesions consistent with fungal infection Neurological: alert. Still demonstrates dysarthria and expressive aphasia with word finding difficulties which is somewhat improved compared to prior. RUE and LUE motor strength is 4/5 compared to left Psychiatric: normal mood and affect  Assessment/Plan: Bryan Hobbs is a 70 y.o. male with hx of hypertension and melanoma presenting for acute onset aphasia, CT head concerning for 2 new masses with hemorrhage.   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 CT demonstrated 2 masses new from last scan in November, one with hemorrhage.  MRI completed under general anesthesia.  Hx of melanoma x3 excised in  1992, 2012, 2017. CT abd, pelvis, and chest without evidence of primary tumor. No know immunocompromising conditions, HIV negative.   - Neurology and neurosurgery following - Follow-up prostate MRI - Decadron daily per neurosurgery for cerebral edema - Keppra and Ativan per neurology - Neuro checks  Fungal intertrigo Noted redness of inguinal folds with satellite lesions. - Start fluconazole  CKD stage IIIa Creatinine 1.5 on admission, which seems close to his baseline. - Avoid nephrotoxins - Follow-up microalbumin creatinine ratio   Diet: Dysphagia 3 IVF:  none VTE:  SCD Prior to Admission Living Arrangement:  home Anticipated Discharge Location:  Home Barriers to Discharge:  Medical workup, possible treatment for brain masses Dispo: Anticipated discharge in approximately 2-5 day(s).   Bryan Au, MD 08/04/2020, 6:33 AM Pager: [phone] After 5pm on weekdays and 1pm on weekends: On Call pager [phone]

## 2020-08-04 NOTE — Anesthesia Postprocedure Evaluation (Signed)
Anesthesia Post Note  Patient: Bryan Hobbs  Procedure(s) Performed: MRI WITH ANESTHESIA (N/A )     Patient location during evaluation: PACU Anesthesia Type: General Level of consciousness: awake and alert Pain management: pain level controlled Vital Signs Assessment: post-procedure vital signs reviewed and stable Respiratory status: spontaneous breathing, nonlabored ventilation, respiratory function stable and patient connected to nasal cannula oxygen Cardiovascular status: blood pressure returned to baseline and stable Postop Assessment: no apparent nausea or vomiting Anesthetic complications: no   No complications documented.  Last Vitals:  Vitals:   08/04/20 1155 08/04/20 1213  BP: 124/73   Pulse: 77   Resp: 19   Temp: 36.6 C 36.7 C  SpO2: 92%     Last Pain:  Vitals:   08/04/20 1213  TempSrc: Axillary  PainSc: 0-No pain                 Belenda Cruise P Juanluis Guastella

## 2020-08-05 ENCOUNTER — Other Ambulatory Visit: Payer: Self-pay | Admitting: Neurosurgery

## 2020-08-05 ENCOUNTER — Encounter (HOSPITAL_COMMUNITY): Payer: Self-pay | Admitting: Radiology

## 2020-08-05 DIAGNOSIS — R972 Elevated prostate specific antigen [PSA]: Secondary | ICD-10-CM | POA: Diagnosis not present

## 2020-08-05 DIAGNOSIS — N4289 Other specified disorders of prostate: Secondary | ICD-10-CM | POA: Diagnosis not present

## 2020-08-05 DIAGNOSIS — G939 Disorder of brain, unspecified: Secondary | ICD-10-CM

## 2020-08-05 LAB — BASIC METABOLIC PANEL
Anion gap: 9 (ref 5–15)
BUN: 32 mg/dL — ABNORMAL HIGH (ref 8–23)
CO2: 24 mmol/L (ref 22–32)
Calcium: 9.1 mg/dL (ref 8.9–10.3)
Chloride: 107 mmol/L (ref 98–111)
Creatinine, Ser: 1.76 mg/dL — ABNORMAL HIGH (ref 0.61–1.24)
GFR, Estimated: 41 mL/min — ABNORMAL LOW (ref >60)
Glucose, Bld: 108 mg/dL — ABNORMAL HIGH (ref 70–99)
Potassium: 4.2 mmol/L (ref 3.5–5.1)
Sodium: 140 mmol/L (ref 135–145)

## 2020-08-05 LAB — CBC
HCT: 47.9 % (ref 39.0–52.0)
Hemoglobin: 15.6 g/dL (ref 13.0–17.0)
MCH: 30.1 pg (ref 26.0–34.0)
MCHC: 32.6 g/dL (ref 30.0–36.0)
MCV: 92.5 fL (ref 80.0–100.0)
Platelets: 214 10*3/uL (ref 150–400)
RBC: 5.18 MIL/uL (ref 4.22–5.81)
RDW: 12.8 % (ref 11.5–15.5)
WBC: 11.7 10*3/uL — ABNORMAL HIGH (ref 4.0–10.5)
nRBC: 0 % (ref 0.0–0.2)

## 2020-08-05 LAB — HEMOGLOBIN A1C
Hgb A1c MFr Bld: 5.8 % — ABNORMAL HIGH (ref 4.8–5.6)
Mean Plasma Glucose: 119.76 mg/dL

## 2020-08-05 LAB — PSA: Prostatic Specific Antigen: 18.93 ng/mL — ABNORMAL HIGH (ref 0.00–4.00)

## 2020-08-05 NOTE — Progress Notes (Signed)
[place]  [phone] [phone]     ID: Bryan Hobbs DOB: June 03, 1950  Bryan#: [id number]  JYN#:[id number]  Patient Care Team: Pcp, No as PCP - General Bryan Cruel, MD OTHER MD:  CHIEF COMPLAINT: hemorrhagic brain lesion, second brain lesion; likely early stage prostate cancer  CURRENT TREATMENT: awaiting craniotomy   HISTORY OF CURRENT ILLNESS: Bryan Hobbs and his husband Bryan Hobbs were on their way from the [place] when on the morning of 08/02/2020 the patient developed aphasia. He presented to the ED where code stroke was initiated and non-contrast head CT was obtained, sowing a hemorrhagic left fronto-parietal lesion measuring 3.4 cm and a second right parietal lesion measuring 1.4 cm. MRI the next day showed:  1. Large hemorrhagic and peripherally enhancing heterogeneous mass in the left posterior frontal lobe and additional enhancing mass in the right parietal lobe, as detailed above. Given multifocality, findings are concerning for metastatic disease. Multicentric high-grade glioma is a less likely differential consideration. 2. Exuberant surrounding edema. Resulting mass effect with partial left lateral ventricular effacement and 3 mm of rightward midline shift. 3. Small focus of nonenhancing left frontal periventricular restricted diffusion, which could represent acute infarct or an additional nonenhancing lesion. Recommend attention on close follow-up. 4. Heterogeneous T1 hyperintensity involving the left nasopharynx, which is indeterminate but potentially represents proteinaceous retention cysts. This area is amenable to direct inspection. .staging  The patient has a history of cutaneous melanoma, s/p prior resections x 3 (left shoulder, right chest, right upper arm), most recently 2017. In addition he was being worked up for an elevated PSA by his Bryan Hobbs MDs (according to the patient Bx was planned but  postponed). A prostate MRI 08/03/2020 showed:  1. PIRADS category 5 lesion in the RIGHT posterolateral transitional zone near the base to mid of the RIGHT prostate gland. Highly suspicious for prostate neoplasm. 2. PIRADS category 3 lesion in the LEFT anterior mid gland transitional zone. 3. Findings of prior prostatitis. 4. Signs of BPH.  Also staging CT scans of the chest, abdomen and pelvis were obtained 08/02/2020. These show no evidence of metastases and specifically no pelvic adenopathy or bone lesions.  Bryan Hobbs was evaluated by [facility] and craniotomy is planned for next week  INTERVAL HISTORY: I met with Bryan Hobbs and his husband in the patient's room the evening of 08/05/2020  REVIEW OF SYSTEMS: Bryan Hobbs tells me his speech is much clearer now (very articulate, he only had occasional trouble finding a word, rarely mispronounced a word, and was able to talk around a phrase he couldn't come up with). He denies headaches, visual changes, N/V, dizzyness or falls. He denies focal weakness. A detailed ROS was otherwise non-contributory  COVID 19 VACCINATION STATUS: refuses vaccination; had COVID December 2021, treated with ivermectin  PAST MEDICAL HISTORY: Past Medical History:  Diagnosis Date  . Hepatitis A   . Hypertension   . Melanoma ([place])   . Pre-diabetes   . Sleep apnea   Hepatic steatosis  PAST SURGICAL HISTORY: Past Surgical History:  Procedure Laterality Date  . APPENDECTOMY  2017  . CYSTOSCOPY W/ URETERAL STENT PLACEMENT    . HERNIA REPAIR    . Echo, 2010, 2018  . RADIOLOGY WITH ANESTHESIA N/A 08/04/2020   Procedure: MRI WITH ANESTHESIA;  Surgeon: Radiologist, Medication, MD;  Location: [facility];  Service: Radiology;  Laterality: N/A;    FAMILY HISTORY Family History  Problem Relation Age of Onset  . Cancer  Sister   The patient does not know the cause of death of his parents--father died age 67, mother age 87. The patient has one brother with a  history of skin cancer, unclear type, and a sister who died from cancer, patient does not know what type. A paternal great grandmother had cancer. 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 He has four children from his first marriage, three surviving. When the patient came out as gay his family cut contact with him. His only communication with them is through a son-in-law who is an MD (he has  let the children know the patient's current situation). Bryan Hobbs married Bryan Hobbs in 2008. Bryan Hobbs is a Animal nutritionist in the local [place]. Bryan Hobbs is a Psychologist, forensic   ADVANCED DIRECTIVES: in the absence of any documents to the contrary, the patient's husband is his HCPOA  HEALTH MAINTENANCE: Social History   Tobacco Use  . Smoking status: Never Smoker  Substance Use Topics  . Alcohol use: Yes    Alcohol/week: 4.0 standard drinks    Types: 4 Glasses of wine per week  . Drug use: Never     Colonoscopy: UTD  PSA: 18.93 on 08/05/2020  Bone density:   Allergies  Allergen Reactions  . Peanut-Containing Drug Products Swelling  . Shellfish Allergy Swelling  . Penicillins     He was told by a parent that he had an allergy as a child    Current Facility-Administered Medications  Medication Dose Route Frequency Provider Last Rate Last Admin  . acetaminophen (TYLENOL) tablet 650 mg  650 mg Oral Q6H PRN Jose Persia, MD       Or  . acetaminophen (TYLENOL) suppository 650 mg  650 mg Rectal Q6H PRN Jose Persia, MD      . levETIRAcetam (KEPPRA) tablet 500 mg  500 mg Oral BID Rikki Spearing, NP   500 mg at 08/05/20 1011  . multivitamin with minerals tablet 1 tablet  1 tablet Oral Daily Jose Persia, MD   1 tablet at 08/05/20 1011  . ondansetron (ZOFRAN) tablet 4 mg  4 mg Oral Q6H PRN  Jose Persia, MD       Or  . ondansetron (ZOFRAN) injection 4 mg  4 mg Intravenous Q6H PRN Jose Persia, MD      . polyethylene glycol (MIRALAX / GLYCOLAX) packet 17 g  17 g Oral Daily PRN Jose Persia, MD      . sodium chloride flush (NS) 0.9 % injection 3 mL  3 mL Intravenous Q12H Jose Persia, MD   3 mL at 08/05/20 1011    OBJECTIVE: white man examined in bed  Vitals:   08/05/20 1559 08/05/20 1955  BP: 106/72 111/72  Pulse: 69 61  Resp: 18 16  Temp: 98.2 F (36.8 C) 98.1 F (36.7 C)  SpO2: 92% 92%     Body mass index is 34.25 kg/m.   Wt Readings from Last 3 Encounters:  08/02/20 218 lb 11.1 oz (99.2 kg)      ECOG FS:2 - Symptomatic, <50% confined to bed  Ocular: Sclerae unicteric Lymphatic: No cervical or supraclavicular adenopathy, no axillary adenopathy Lungs no rales or rhonchi Heart regular rate and rhythm Abd soft, nontender, positive bowel sounds MSK no focal spinal tenderness Neuro: non-focal, well-oriented, appropriate affect; speech as described above Skin: scars from  prior melanoma resections noted; no suspicious skin lesions   LAB RESULTS:  CMP     Component Value Date/Time   NA 140 08/05/2020 0748   K 4.2 08/05/2020 0748   CL 107 08/05/2020 0748   CO2 24 08/05/2020 0748   GLUCOSE 108 (H) 08/05/2020 0748   BUN 32 (H) 08/05/2020 0748   CREATININE 1.76 (H) 08/05/2020 0748   CALCIUM 9.1 08/05/2020 0748   PROT 6.8 08/03/2020 0547   ALBUMIN 3.7 08/03/2020 0547   AST 19 08/03/2020 0547   ALT 21 08/03/2020 0547   ALKPHOS 37 (L) 08/03/2020 0547   BILITOT 0.7 08/03/2020 0547   GFRNONAA 41 (L) 08/05/2020 0748    No results found for: TOTALPROTELP, ALBUMINELP, A1GS, A2GS, BETS, BETA2SER, GAMS, MSPIKE, SPEI  No results found for: KPAFRELGTCHN, LAMBDASER, KAPLAMBRATIO  Lab Results  Component Value Date   WBC 11.7 (H) 08/05/2020   NEUTROABS 7.1 08/03/2020   HGB 15.6 08/05/2020   HCT 47.9 08/05/2020   MCV 92.5 08/05/2020   PLT 214  08/05/2020    _0 @  No results found for: LABCA2  No components found for: [id number]  Recent Labs  Lab 08/02/20 1202  INR 1.0    No results found for: LABCA2  No results found for: TIR443  No results found for: XVQ008  No results found for: QPY195  No results found for: CA2729  No components found for: HGQUANT  No results found for: CEA1 / No results found for: CEA1   No results found for: AFPTUMOR  No results found for: CHROMOGRNA  No results found for: PSA1  Admission on 08/02/2020  Component Date Value Ref Range Status  . Prothrombin Time 08/02/2020 12.9  11.4 - 15.2 seconds Final  . INR 08/02/2020 1.0  0.8 - 1.2 Final   Comment: (NOTE) INR goal varies based on device and disease states. Performed at [facility], [place] [address], [place], [place] [address]   . aPTT 08/02/2020 25  24 - 36 seconds Final   Performed at [facility], [place] [address], [place], [place] [address]  . WBC 08/02/2020 7.5  4.0 - 10.5 K/uL Final  . RBC 08/02/2020 5.24  4.22 - 5.81 MIL/uL Final  . Hemoglobin 08/02/2020 16.0  13.0 - 17.0 g/dL Final  . HCT 08/02/2020 48.7  39.0 - 52.0 % Final  . MCV 08/02/2020 92.9  80.0 - 100.0 fL Final  . MCH 08/02/2020 30.5  26.0 - 34.0 pg Final  . MCHC 08/02/2020 32.9  30.0 - 36.0 g/dL Final  . RDW 08/02/2020 12.8  11.5 - 15.5 % Final  . Platelets 08/02/2020 202  150 - 400 K/uL Final  . nRBC 08/02/2020 0.0  0.0 - 0.2 % Final   Performed at [facility], [place] [address], [place],  [address]  . Neutrophils Relative % 08/02/2020 66  % Final  . Neutro Abs 08/02/2020 5.0  1.7 - 7.7 K/uL Final  . Lymphocytes Relative 08/02/2020 22  % Final  . Lymphs Abs 08/02/2020 1.7  0.7 - 4.0 K/uL Final  . Monocytes Relative 08/02/2020 8  % Final  . Monocytes Absolute 08/02/2020 0.6  0.1 - 1.0 K/uL Final  . Eosinophils Relative 08/02/2020 3  % Final  . Eosinophils Absolute 08/02/2020 0.2  0.0 - 0.5 K/uL Final  . Basophils  Relative 08/02/2020 0  % Final  . Basophils Absolute 08/02/2020 0.0  0.0 - 0.1 K/uL Final  . Immature Granulocytes 08/02/2020 1  %  Final  . Abs Immature Granulocytes 08/02/2020 0.06  0.00 - 0.07 K/uL Final   Performed at [facility] [address], [place], [place] [address]  . Sodium 08/02/2020 139  135 - 145 mmol/L Final  . Potassium 08/02/2020 3.9  3.5 - 5.1 mmol/L Final  . Chloride 08/02/2020 106  98 - 111 mmol/L Final  . CO2 08/02/2020 24  22 - 32 mmol/L Final  . Glucose, Bld 08/02/2020 129* 70 - 99 mg/dL Final   Glucose reference range applies only to samples taken after fasting for at least 8 hours.  . BUN 08/02/2020 22  8 - 23 mg/dL Final  . Creatinine, Ser 08/02/2020 1.68* 0.61 - 1.24 mg/dL Final  . Calcium 08/02/2020 9.4  8.9 - 10.3 mg/dL Final  . Total Protein 08/02/2020 7.0  6.5 - 8.1 g/dL Final  . Albumin 08/02/2020 4.0  3.5 - 5.0 g/dL Final  . AST 08/02/2020 20  15 - 41 U/L Final  . ALT 08/02/2020 20  0 - 44 U/L Final  . Alkaline Phosphatase 08/02/2020 34* 38 - 126 U/L Final  . Total Bilirubin 08/02/2020 0.9  0.3 - 1.2 mg/dL Final  . GFR, Estimated 08/02/2020 44* >60 mL/min Final   Comment: (NOTE) Calculated using the CKD-EPI Creatinine Equation (2021)   . Anion gap 08/02/2020 9  5 - 15 Final   Performed at [facility] [address] [place], [place] [address]  . Sodium 08/02/2020 142  135 - 145 mmol/L Final  . Potassium 08/02/2020 3.9  3.5 - 5.1 mmol/L Final  . Chloride 08/02/2020 107  98 - 111 mmol/L Final  . BUN 08/02/2020 26* 8 - 23 mg/dL Final  . Creatinine, Ser 08/02/2020 1.50* 0.61 - 1.24 mg/dL Final  . Glucose, Bld 08/02/2020 129* 70 - 99 mg/dL Final   Glucose reference range applies only to samples taken after fasting for at least 8 hours.  . Calcium, Ion 08/02/2020 1.12* 1.15 - 1.40 mmol/L Final  . TCO2 08/02/2020 24  22 - 32 mmol/L Final  . Hemoglobin 08/02/2020 16.7  13.0 - 17.0 g/dL Final  . HCT 08/02/2020 49.0  39.0 - 52.0 % Final  .  Glucose-Capillary 08/02/2020 134* 70 - 99 mg/dL Final   Glucose reference range applies only to samples taken after fasting for at least 8 hours.  Marland Kitchen SARS Coronavirus 2 by RT PCR 08/02/2020 NEGATIVE  NEGATIVE Final   Comment: (NOTE) SARS-CoV-2 target nucleic acids are NOT DETECTED.  The SARS-CoV-2 RNA is generally detectable in upper respiratory specimens during the acute phase of infection. The lowest concentration of SARS-CoV-2 viral copies this assay can detect is 138 copies/mL. A negative result does not preclude SARS-Cov-2 infection and should not be used as the sole basis for treatment or other patient management decisions. A negative result may occur with  improper specimen collection/handling, submission of specimen other than nasopharyngeal swab, presence of viral mutation(s) within the areas targeted by this assay, and inadequate number of viral copies(<138 copies/mL). A negative result must be combined with clinical observations, patient history, and epidemiological information. The expected result is Negative.  Fact Sheet for Patients:  EntrepreneurPulse.com.au  Fact Sheet for Healthcare Providers:  IncredibleEmployment.be  This test is no                          t yet approved or cleared by the [place] FDA and  has been authorized for detection and/or diagnosis  of SARS-CoV-2 by FDA under an Emergency Use Authorization (EUA). This EUA will remain  in effect (meaning this test can be used) for the duration of the COVID-19 declaration under Section 564(b)(1) of the Act, 21 U.S.C.section 360bbb-3(b)(1), unless the authorization is terminated  or revoked sooner.      . Influenza A by PCR 08/02/2020 NEGATIVE  NEGATIVE Final  . Influenza B by PCR 08/02/2020 NEGATIVE  NEGATIVE Final   Comment: (NOTE) The Xpert Xpress SARS-CoV-2/FLU/RSV plus assay is intended as an aid in the diagnosis of influenza from Nasopharyngeal swab specimens  and should not be used as a sole basis for treatment. Nasal washings and aspirates are unacceptable for Xpert Xpress SARS-CoV-2/FLU/RSV testing.  Fact Sheet for Patients: EntrepreneurPulse.com.au  Fact Sheet for Healthcare Providers: IncredibleEmployment.be  This test is not yet approved or cleared by the [place] FDA and has been authorized for detection and/or diagnosis of SARS-CoV-2 by FDA under an Emergency Use Authorization (EUA). This EUA will remain in effect (meaning this test can be used) for the duration of the COVID-19 declaration under Section 564(b)(1) of the Act, 21 U.S.C. section 360bbb-3(b)(1), unless the authorization is terminated or revoked.  Performed at [facility], [place] [address], [place], [place] [address]   . WBC 08/03/2020 8.0  4.0 - 10.5 K/uL Final  . RBC 08/03/2020 5.50  4.22 - 5.81 MIL/uL Final  . Hemoglobin 08/03/2020 16.4  13.0 - 17.0 g/dL Final  . HCT 08/03/2020 50.9  39.0 - 52.0 % Final  . MCV 08/03/2020 92.5  80.0 - 100.0 fL Final  . MCH 08/03/2020 29.8  26.0 - 34.0 pg Final  . MCHC 08/03/2020 32.2  30.0 - 36.0 g/dL Final  . RDW 08/03/2020 12.5  11.5 - 15.5 % Final  . Platelets 08/03/2020 228  150 - 400 K/uL Final  . nRBC 08/03/2020 0.0  0.0 - 0.2 % Final  . Neutrophils Relative % 08/03/2020 89  % Final  . Neutro Abs 08/03/2020 7.1  1.7 - 7.7 K/uL Final  . Lymphocytes Relative 08/03/2020 9  % Final  . Lymphs Abs 08/03/2020 0.8  0.7 - 4.0 K/uL Final  . Monocytes Relative 08/03/2020 1  % Final  . Monocytes Absolute 08/03/2020 0.1  0.1 - 1.0 K/uL Final  . Eosinophils Relative 08/03/2020 0  % Final  . Eosinophils Absolute 08/03/2020 0.0  0.0 - 0.5 K/uL Final  . Basophils Relative 08/03/2020 0  % Final  . Basophils Absolute 08/03/2020 0.0  0.0 - 0.1 K/uL Final  . Immature Granulocytes 08/03/2020 1  % Final  . Abs Immature Granulocytes 08/03/2020 0.06  0.00 - 0.07 K/uL Final   Performed at [facility], [place] [address] [place], [place] [address]  . Sodium 08/03/2020 140  135 - 145 mmol/L Final  . Potassium 08/03/2020 4.5  3.5 - 5.1 mmol/L Final  . Chloride 08/03/2020 108  98 - 111 mmol/L Final  . CO2 08/03/2020 23  22 - 32 mmol/L Final  . Glucose, Bld 08/03/2020 151* 70 - 99 mg/dL Final   Glucose reference range applies only to samples taken after fasting for at least 8 hours.  . BUN 08/03/2020 18  8 - 23 mg/dL Final  . Creatinine, Ser 08/03/2020 1.47* 0.61 - 1.24 mg/dL Final  . Calcium 08/03/2020 9.4  8.9 - 10.3 mg/dL Final  . Total Protein 08/03/2020 6.8  6.5 - 8.1 g/dL Final  . Albumin 08/03/2020 3.7  3.5 - 5.0 g/dL Final  .  AST 08/03/2020 19  15 - 41 U/L Final  . ALT 08/03/2020 21  0 - 44 U/L Final  . Alkaline Phosphatase 08/03/2020 37* 38 - 126 U/L Final  . Total Bilirubin 08/03/2020 0.7  0.3 - 1.2 mg/dL Final  . GFR, Estimated 08/03/2020 51* >60 mL/min Final   Comment: (NOTE) Calculated using the CKD-EPI Creatinine Equation (2021)   . Anion gap 08/03/2020 9  5 - 15 Final   Performed at [facility] [address] [place], [place] [address]  . MRSA by PCR 08/03/2020 NEGATIVE  NEGATIVE Final   Comment:        The GeneXpert MRSA Assay (FDA approved for NASAL specimens only), is one component of a comprehensive MRSA colonization surveillance program. It is not intended to diagnose MRSA infection nor to guide or monitor treatment for MRSA infections. Performed at [facility], [place] [address], [place], [place] [address]   . HIV Screen 4th Generation wRfx 08/03/2020 Non Reactive  Non Reactive Final   Performed at [facility], [place] [address], [place], [place] [address]  . Glucose-Capillary 08/04/2020 151* 70 - 99 mg/dL Final   Glucose reference range applies only to samples taken after fasting for at least 8 hours.  . Comment 1 08/04/2020 Notify RN   Final  . Hgb A1c MFr Bld 08/05/2020 5.8* 4.8 - 5.6 % Final   Comment: (NOTE) Pre diabetes:           5.7%-6.4%  Diabetes:              >6.4%  Glycemic control for   <7.0% adults with diabetes   . Mean Plasma Glucose 08/05/2020 119.76  mg/dL Final   Performed at [facility] [address] [place], [place] [address]  . WBC 08/05/2020 11.7* 4.0 - 10.5 K/uL Final  . RBC 08/05/2020 5.18  4.22 - 5.81 MIL/uL Final  . Hemoglobin 08/05/2020 15.6  13.0 - 17.0 g/dL Final  . HCT 08/05/2020 47.9  39.0 - 52.0 % Final  . MCV 08/05/2020 92.5  80.0 - 100.0 fL Final  . MCH 08/05/2020 30.1  26.0 - 34.0 pg Final  . MCHC 08/05/2020 32.6  30.0 - 36.0 g/dL Final  . RDW 08/05/2020 12.8  11.5 - 15.5 % Final  . Platelets 08/05/2020 214  150 - 400 K/uL Final  . nRBC 08/05/2020 0.0  0.0 - 0.2 % Final   Performed at [facility], [place] [address] [place], [place] [address]  . Sodium 08/05/2020 140  135 - 145 mmol/L Final  . Potassium 08/05/2020 4.2  3.5 - 5.1 mmol/L Final   SPECIMEN HEMOLYZED. HEMOLYSIS MAY AFFECT INTEGRITY OF RESULTS.  Marland Kitchen Chloride 08/05/2020 107  98 - 111 mmol/L Final  . CO2 08/05/2020 24  22 - 32 mmol/L Final  . Glucose, Bld 08/05/2020 108* 70 - 99 mg/dL Final   Glucose reference range applies only to samples taken after fasting for at least 8 hours.  . BUN 08/05/2020 32* 8 - 23 mg/dL Final  . Creatinine, Ser 08/05/2020 1.76* 0.61 - 1.24 mg/dL Final  . Calcium 08/05/2020 9.1  8.9 - 10.3 mg/dL Final  . GFR, Estimated 08/05/2020 41* >60 mL/min Final   Comment: (NOTE) Calculated using the CKD-EPI Creatinine Equation (2021)   . Anion gap 08/05/2020 9  5 - 15 Final   Performed at [facility] [address], [place], [place] [address]  . Prostatic Specific Antigen 08/05/2020  18.93* 0.00 - 4.00 ng/mL Final   Comment: (NOTE) While PSA levels of <=4.0 ng/ml are reported as reference range, some men with levels below 4.0 ng/ml can have prostate cancer and many men with PSA above 4.0 ng/ml do not have prostate cancer.  Other tests such as free PSA, age specific reference  ranges, PSA velocity and PSA doubling time may be helpful especially in men less than 46 years old. Performed at [facility], [place] [address], [place], [place] [address]     (this displays the last labs from the last 3 days)  No results found for: TOTALPROTELP, ALBUMINELP, A1GS, A2GS, BETS, BETA2SER, GAMS, MSPIKE, SPEI (this displays SPEP labs)  No results found for: KPAFRELGTCHN, LAMBDASER, KAPLAMBRATIO (kappa/lambda light chains)  No results found for: HGBA, HGBA2QUANT, HGBFQUANT, HGBSQUAN (Hemoglobinopathy evaluation)   No results found for: LDH  No results found for: IRON, TIBC, IRONPCTSAT (Iron and TIBC)  No results found for: FERRITIN  Urinalysis No results found for: COLORURINE, APPEARANCEUR, LABSPEC, PHURINE, GLUCOSEU, HGBUR, BILIRUBINUR, KETONESUR, PROTEINUR, UROBILINOGEN, NITRITE, LEUKOCYTESUR   STUDIES: CT Chest W Contrast  Result Date: 08/02/2020 CLINICAL DATA:  CNS neoplasm, staging evaluation EXAM: CT CHEST, ABDOMEN, AND PELVIS WITH CONTRAST TECHNIQUE: Multidetector CT imaging of the chest, abdomen and pelvis was performed following the standard protocol during bolus administration of intravenous contrast. CONTRAST:  30m OMNIPAQUE IOHEXOL 300 MG/ML  SOLN COMPARISON:  None. FINDINGS: CT CHEST FINDINGS Cardiovascular: The heart is normal in size. No pericardial effusion. No evidence of thoracic aortic aneurysm. Coronary atherosclerosis of the LAD. Mediastinum/Nodes: No suspicious mediastinal lymphadenopathy. Visualized thyroid is unremarkable. Lungs/Pleura: Mild dependent atelectasis in the bilateral upper and lower lobes. No focal consolidation. No suspicious pulmonary nodules. No pleural effusion or pneumothorax. Musculoskeletal: Degenerative changes of the thoracic spine. CT ABDOMEN PELVIS FINDINGS Hepatobiliary: Liver is notable for hepatic steatosis with focal fatty sparing along the gallbladder fossa. Gallbladder is unremarkable. No intrahepatic or  extrahepatic ductal dilatation. Pancreas: Within normal limits. Spleen: Within normal limits, noting a splenule in the splenic hilum (series 3/image 56). Adrenals/Urinary Tract: Adrenal glands are within normal limits. Bilateral renal cysts, measuring up to 5.5 cm in the anterior left upper kidney (series 3/image 62). 2 mm nonobstructing left lower pole renal calculus (series 3/image 69). No ureteral or bladder calculi. No hydronephrosis. Bladder is within normal limits. Stomach/Bowel: Stomach is within normal limits. No evidence of bowel obstruction. Prior appendectomy. Mild left colonic diverticulosis, without evidence of diverticulitis. Vascular/Lymphatic: No evidence of abdominal aortic aneurysm. Mild atherosclerotic calcifications of the abdominal aorta. No suspicious abdominopelvic lymphadenopathy. Reproductive: Prostate is grossly unremarkable. Other: No abdominopelvic ascites. Tiny fat containing bilateral inguinal hernias (series 3/image 113). Musculoskeletal: Degenerative changes of the lumbar spine. IMPRESSION: No CT findings suspicious for primary malignancy in the chest, abdomen, or pelvis. Electronically Signed   By: SJulian HyM.D.   On: 08/02/2020 17:04   Bryan BRAIN W WO CONTRAST  Result Date: 08/04/2020 CLINICAL DATA:  Stroke follow-up. EXAM: MRI HEAD WITHOUT AND WITH CONTRAST TECHNIQUE: Multiplanar, multiecho pulse sequences of the brain and surrounding structures were obtained without and with intravenous contrast. CONTRAST:  964mGADAVIST GADOBUTROL 1 MMOL/ML IV SOLN COMPARISON:  None. FINDINGS: Brain: Approximately 3.4 by 3.1 by 3.4 cm hemorrhagic mass in the posterior left frontal lobe with areas of internal T2 hypointensity, there a genius T1 hyperintensity and susceptibility artifact. There is irregular and wispy peripheral enhancement and restricted diffusion, compatible with hypercellularity. The mass abuts the overlying left frontal dura. dditional  1.5 by 0.6 by 0.9 cm enhancing  mass within the right parietal lobe, which also demonstrates some faint peripheral restricted diffusion, compatible with hypercellularity. Both lesions demonstrate exuberant surrounding vasogenic edema. Resulting mass effect on the left lateral ventricle which is partially effaced. Additionally, there is approximately 3 mm of rightward midline shift at the foramen of Monro. Small focus of nonenhancing left frontal periventricular restricted diffusion (series 450, image 33). Additional scattered mild T2/FLAIR hyperintensities within the white matter, nonspecific but likely related to chronic microvascular ischemic disease. No hydrocephalus. Vascular: Major arterial flow voids are maintained at the skull base. Skull and upper cervical spine: Normal marrow signal. Sinuses/Orbits: Mucosal thickening of ethmoid air cells, frontal sinuses and sphenoid sinuses. Unremarkable orbits. Other: Heterogeneous T1 hyperintensity involving the left nasopharynx. No mastoid effusions. IMPRESSION: 1. Large hemorrhagic and peripherally enhancing heterogeneous mass in the left posterior frontal lobe and additional enhancing mass in the right parietal lobe, as detailed above. Given multifocality, findings are concerning for metastatic disease. Multicentric high-grade glioma is a less likely differential consideration. 2. Exuberant surrounding edema. Resulting mass effect with partial left lateral ventricular effacement and 3 mm of rightward midline shift. 3. Small focus of nonenhancing left frontal periventricular restricted diffusion, which could represent acute infarct or an additional nonenhancing lesion. Recommend attention on close follow-up. 4. Heterogeneous T1 hyperintensity involving the left nasopharynx, which is indeterminate but potentially represents proteinaceous retention cysts. This area is amenable to direct inspection. Electronically Signed   By: Margaretha Sheffield MD   On: 08/04/2020 13:18   CT Abdomen Pelvis W  Contrast  Result Date: 08/02/2020 CLINICAL DATA:  CNS neoplasm, staging evaluation EXAM: CT CHEST, ABDOMEN, AND PELVIS WITH CONTRAST TECHNIQUE: Multidetector CT imaging of the chest, abdomen and pelvis was performed following the standard protocol during bolus administration of intravenous contrast. CONTRAST:  62m OMNIPAQUE IOHEXOL 300 MG/ML  SOLN COMPARISON:  None. FINDINGS: CT CHEST FINDINGS Cardiovascular: The heart is normal in size. No pericardial effusion. No evidence of thoracic aortic aneurysm. Coronary atherosclerosis of the LAD. Mediastinum/Nodes: No suspicious mediastinal lymphadenopathy. Visualized thyroid is unremarkable. Lungs/Pleura: Mild dependent atelectasis in the bilateral upper and lower lobes. No focal consolidation. No suspicious pulmonary nodules. No pleural effusion or pneumothorax. Musculoskeletal: Degenerative changes of the thoracic spine. CT ABDOMEN PELVIS FINDINGS Hepatobiliary: Liver is notable for hepatic steatosis with focal fatty sparing along the gallbladder fossa. Gallbladder is unremarkable. No intrahepatic or extrahepatic ductal dilatation. Pancreas: Within normal limits. Spleen: Within normal limits, noting a splenule in the splenic hilum (series 3/image 56). Adrenals/Urinary Tract: Adrenal glands are within normal limits. Bilateral renal cysts, measuring up to 5.5 cm in the anterior left upper kidney (series 3/image 62). 2 mm nonobstructing left lower pole renal calculus (series 3/image 69). No ureteral or bladder calculi. No hydronephrosis. Bladder is within normal limits. Stomach/Bowel: Stomach is within normal limits. No evidence of bowel obstruction. Prior appendectomy. Mild left colonic diverticulosis, without evidence of diverticulitis. Vascular/Lymphatic: No evidence of abdominal aortic aneurysm. Mild atherosclerotic calcifications of the abdominal aorta. No suspicious abdominopelvic lymphadenopathy. Reproductive: Prostate is grossly unremarkable. Other: No  abdominopelvic ascites. Tiny fat containing bilateral inguinal hernias (series 3/image 113). Musculoskeletal: Degenerative changes of the lumbar spine. IMPRESSION: No CT findings suspicious for primary malignancy in the chest, abdomen, or pelvis. Electronically Signed   By: SJulian HyM.D.   On: 08/02/2020 17:04   Bryan PROSTATE W WO CONTRAST  Result Date: 08/04/2020 CLINICAL DATA:  Enlarged prostate with elevated PSA by report. EXAM: Bryan PROSTATE WITHOUT  AND WITH CONTRAST TECHNIQUE: Multiplanar multisequence MRI images were obtained of the pelvis centered about the prostate. Pre and post contrast images were obtained. CONTRAST:  70m GADAVIST GADOBUTROL 1 MMOL/ML IV SOLN COMPARISON:  None. FINDINGS: Prostate: Transitional zone: (Image 15, series 18) homogeneous low T2 signal with lenticular appearance along the surgical capsule of the RIGHT posterolateral transitional zone near the base to mid of the RIGHT prostate gland showing very dark appearance on the ADC map and bright appearance on diffusion-weighted imaging PIRADS category 5 Signs of BPH elsewhere but with other areas of restricted diffusion most notably in the LEFT anterior mid gland. This does not correspond to a homogeneous area of low T2 signal and is therefore classified as PIRADS category 3 is that also is contained within a BPH nodule (image 13 of the ADC map). LEFT mid gland anterior transitional zone Peripheral zone: Linear and wedge-shaped areas of T2 hypointensity throughout the peripheral zone compatible with prior prostatitis. No signs of post biopsy hemorrhage. Volume: 51.3 cc Transcapsular spread:  Absent Seminal vesicle involvement: Absent Neurovascular bundle involvement: Absent Pelvic adenopathy: Absent Bone metastasis: Absent Other findings: None IMPRESSION: 1. PIRADS category 5 lesion in the RIGHT posterolateral transitional zone near the base to mid of the RIGHT prostate gland. Highly suspicious for prostate neoplasm. 2. PIRADS  category 3 lesion in the LEFT anterior mid gland transitional zone. 3. Findings of prior prostatitis. 4. Signs of BPH. Electronically Signed   By: GZetta BillsM.D.   On: 08/04/2020 15:33   EEG adult  Result Date: 08/03/2020 YLora Havens MD     08/03/2020  1:17 PM Patient Name: SQuindarrius JoplinMRN: [id number] Attending: PLora HavensReferring Physician/Provider: Dr SLesleigh NoeDate: 08/03/2020 Duration: 23 mins Patient history: 70year old male with acute onset aphasia.  EEG to evaluate for seizures. Level of alertness: Awake, asleep AEDs during EEG study: Keppra Technical aspects: This EEG study was done with scalp electrodes positioned according to the 10-20 International system of electrode placement. Electrical activity was acquired at a sampling rate of _0  and reviewed with a high frequency filter of _1  and a low frequency filter of _2 . EEG data were recorded continuously and digitally stored. Description: The posterior dominant rhythm consists of 9-10 Hz activity of moderate voltage (25-35 uV) seen predominantly in posterior head regions, symmetric and reactive to eye opening and eye closing. Sleep was characterized by vertex waves, sleep spindles (12 to 14 Hz), maximal frontocentral region. Hyperventilation and photic stimulation were not performed.   IMPRESSION: This study is within normal limits. No seizures or epileptiform discharges were seen throughout the recording. PLora Havens  CT HEAD CODE STROKE WO CONTRAST  Result Date: 08/02/2020 CLINICAL DATA:  Code stroke. Neuro deficit. Acute stroke suspected. New onset of aphasia and left-sided facial droop beginning 2 hours ago. EXAM: CT HEAD WITHOUT CONTRAST TECHNIQUE: Contiguous axial images were obtained from the base of the skull through the vertex without intravenous contrast. COMPARISON:  None. FINDINGS: Brain: Mass lesion is present in the left posterior frontal and parietal lobe measuring 3.4 x 2.6 by 2.8 cm.  Hyperdense material anteriorly is compatible with acute hemorrhage. A second lesion is present in the posterior right parietal lobe measuring up to 1.4 cm. Vasogenic edema surrounds both lesions. There is some local mass effect in the posterior left frontal lobe and parietal lobe with effacement of the sulci. No significant midline shift is present. The basal ganglia are intact. No acute or focal cortical abnormalities  are present. White matter is otherwise unremarkable. Vascular: No hyperdense vessel or unexpected calcification. Skull: Calvarium is intact. No focal lytic or blastic lesions are present. No significant extracranial soft tissue lesion is present. Sinuses/Orbits: Polyp or mucous retention cyst occludes the right ostiomeatal complex. Scattered opacification of ethmoid air cells is noted bilaterally. The globes and orbits are within normal limits. IMPRESSION: 1. At least 2 mass lesions identified. The largest is in the posterior left frontal lobe and parietal lobe. Acute hemorrhage is noted in the anterior aspect of this lesion. 2. Second lesion identified within the right parietal lobe. 3. These likely reflect metastatic lesions. 4. No additional acute hemorrhage or focal cortical infarct. Critical Value/emergent results were called by telephone at the time of interpretation on 08/02/2020 at 12:25 pm to provider Dr. Curly Shores, who verbally acknowledged these results. Electronically Signed   By: San Morelle M.D.   On: 08/02/2020 12:29    ELIGIBLE FOR AVAILABLE RESEARCH PROTOCOL:   ASSESSMENT: 70 y.o. [place] man presenting with aphasia and found to have 2 brain lesions, one hemorrhagic, in the setting of a prior history of cutaneous melanoma; also with a very elevated PSA and MRI evidence of prostate cancer.  PLAN: We discussed first of all that prostate cancer and melanoma are not related, and that prostate cancer is not likely to be the cause of his brain lesions. In general, prostate cancer  spreads first to regional lymph nodes and bones and we do not see evidence of this in his scans. Accordingly if he does have prostate cancer, as we believe, it is likely to be localized and therefore potentially curable.We discussed briefly treatment options for prostate cancer but I suggested he postpone further workup and treatment of that problem until he gets back home.   We reviewed the brain findings and he understands they are c/w melanoma metastases, but we will not know that until after the craniotomy. We discussed treatment of melanoma in the CNS which in addition to resection includes radiation and we reviewed the difference between whole-brain XRT and stereotactic XRT. I also suggested once he is discharged he should have a PET scan to better stage the melanoma. That and molecular studies from the tumor itself would further guide systemic therapy.  At this point we have to wait on results from the surgery. I will follow peripherally and discuss those results with the patient once they are available.  Appreciate consulting on this case.  Bryan Cruel, MD   08/05/2020 8:07 PM Medical Oncology [facility] H[facility]ology [facility] [address] [place], [place] [address] Tel. [phone]    Fax. [phone]

## 2020-08-05 NOTE — Progress Notes (Signed)
Subjective: The patient is alert and pleasant.  His partner is at the bedside.  Objective: Vital signs in last 24 hours: Temp:  [97.8 F (36.6 C)-98.4 F (36.9 C)] 98.4 F (36.9 C) (04/21 0834) Pulse Rate:  [57-78] 62 (04/21 0834) Resp:  [14-26] 18 (04/21 0834) BP: (107-124)/(66-80) 107/79 (04/21 0834) SpO2:  [89 %-98 %] 95 % (04/21 0834) Estimated body mass index is 34.25 kg/m as calculated from the following:   Height as of this encounter: 5\' 7"  (1.702 m).   Weight as of this encounter: 99.2 kg.   Intake/Output from previous day: 04/20 0701 - 04/21 0700 In: 663 [P.O.:360; I.V.:303] Out: 500 [Urine:500] Intake/Output this shift: No intake/output data recorded.  Physical exam the patient is alert and oriented.  He has some mild dysphasia.  I have reviewed his brain MRI performed yesterday with and without contrast.  It demonstrates a large enhancing neuralgic left posterior frontal/parietal and a smaller right parietal lesion.  Lab Results: Recent Labs    08/03/20 0547 08/05/20 0748  WBC 8.0 11.7*  HGB 16.4 15.6  HCT 50.9 47.9  PLT 228 214   BMET Recent Labs    08/03/20 0547 08/05/20 0748  NA 140 140  K 4.5 4.2  CL 108 107  CO2 23 24  GLUCOSE 151* 108*  BUN 18 32*  CREATININE 1.47* 1.76*  CALCIUM 9.4 9.1    Studies/Results: MR BRAIN W WO CONTRAST  Result Date: 08/04/2020 CLINICAL DATA:  Stroke follow-up. EXAM: MRI HEAD WITHOUT AND WITH CONTRAST TECHNIQUE: Multiplanar, multiecho pulse sequences of the brain and surrounding structures were obtained without and with intravenous contrast. CONTRAST:  34mL GADAVIST GADOBUTROL 1 MMOL/ML IV SOLN COMPARISON:  None. FINDINGS: Brain: Approximately 3.4 by 3.1 by 3.4 cm hemorrhagic mass in the posterior left frontal lobe with areas of internal T2 hypointensity, there a genius T1 hyperintensity and susceptibility artifact. There is irregular and wispy peripheral enhancement and restricted diffusion, compatible with  hypercellularity. The mass abuts the overlying left frontal dura. dditional 1.5 by 0.6 by 0.9 cm enhancing mass within the right parietal lobe, which also demonstrates some faint peripheral restricted diffusion, compatible with hypercellularity. Both lesions demonstrate exuberant surrounding vasogenic edema. Resulting mass effect on the left lateral ventricle which is partially effaced. Additionally, there is approximately 3 mm of rightward midline shift at the foramen of Monro. Small focus of nonenhancing left frontal periventricular restricted diffusion (series 450, image 33). Additional scattered mild T2/FLAIR hyperintensities within the white matter, nonspecific but likely related to chronic microvascular ischemic disease. No hydrocephalus. Vascular: Major arterial flow voids are maintained at the skull base. Skull and upper cervical spine: Normal marrow signal. Sinuses/Orbits: Mucosal thickening of ethmoid air cells, frontal sinuses and sphenoid sinuses. Unremarkable orbits. Other: Heterogeneous T1 hyperintensity involving the left nasopharynx. No mastoid effusions. IMPRESSION: 1. Large hemorrhagic and peripherally enhancing heterogeneous mass in the left posterior frontal lobe and additional enhancing mass in the right parietal lobe, as detailed above. Given multifocality, findings are concerning for metastatic disease. Multicentric high-grade glioma is a less likely differential consideration. 2. Exuberant surrounding edema. Resulting mass effect with partial left lateral ventricular effacement and 3 mm of rightward midline shift. 3. Small focus of nonenhancing left frontal periventricular restricted diffusion, which could represent acute infarct or an additional nonenhancing lesion. Recommend attention on close follow-up. 4. Heterogeneous T1 hyperintensity involving the left nasopharynx, which is indeterminate but potentially represents proteinaceous retention cysts. This area is amenable to direct  inspection. Electronically Signed   By:  Margaretha Sheffield MD   On: 08/04/2020 13:18   MR PROSTATE W WO CONTRAST  Result Date: 08/04/2020 CLINICAL DATA:  Enlarged prostate with elevated PSA by report. EXAM: MR PROSTATE WITHOUT AND WITH CONTRAST TECHNIQUE: Multiplanar multisequence MRI images were obtained of the pelvis centered about the prostate. Pre and post contrast images were obtained. CONTRAST:  62mL GADAVIST GADOBUTROL 1 MMOL/ML IV SOLN COMPARISON:  None. FINDINGS: Prostate: Transitional zone: (Image 15, series 18) homogeneous low T2 signal with lenticular appearance along the surgical capsule of the RIGHT posterolateral transitional zone near the base to mid of the RIGHT prostate gland showing very dark appearance on the ADC map and bright appearance on diffusion-weighted imaging PIRADS category 5 Signs of BPH elsewhere but with other areas of restricted diffusion most notably in the LEFT anterior mid gland. This does not correspond to a homogeneous area of low T2 signal and is therefore classified as PIRADS category 3 is that also is contained within a BPH nodule (image 13 of the ADC map). LEFT mid gland anterior transitional zone Peripheral zone: Linear and wedge-shaped areas of T2 hypointensity throughout the peripheral zone compatible with prior prostatitis. No signs of post biopsy hemorrhage. Volume: 51.3 cc Transcapsular spread:  Absent Seminal vesicle involvement: Absent Neurovascular bundle involvement: Absent Pelvic adenopathy: Absent Bone metastasis: Absent Other findings: None IMPRESSION: 1. PIRADS category 5 lesion in the RIGHT posterolateral transitional zone near the base to mid of the RIGHT prostate gland. Highly suspicious for prostate neoplasm. 2. PIRADS category 3 lesion in the LEFT anterior mid gland transitional zone. 3. Findings of prior prostatitis. 4. Signs of BPH. Electronically Signed   By: Zetta Bills M.D.   On: 08/04/2020 15:33   EEG adult  Result Date: 08/03/2020 Lora Havens, MD     08/03/2020  1:17 PM Patient Name: Bryan Hobbs MRN: [id number] Epilepsy Attending: Lora Havens Referring Physician/Provider: Dr Lesleigh Noe Date: 08/03/2020 Duration: 23 mins Patient history: 71 year old male with acute onset aphasia.  EEG to evaluate for seizures. Level of alertness: Awake, asleep AEDs during EEG study: Keppra Technical aspects: This EEG study was done with scalp electrodes positioned according to the 10-20 International system of electrode placement. Electrical activity was acquired at a sampling rate of 500Hz  and reviewed with a high frequency filter of 70Hz  and a low frequency filter of 1Hz . EEG data were recorded continuously and digitally stored. Description: The posterior dominant rhythm consists of 9-10 Hz activity of moderate voltage (25-35 uV) seen predominantly in posterior head regions, symmetric and reactive to eye opening and eye closing. Sleep was characterized by vertex waves, sleep spindles (12 to 14 Hz), maximal frontocentral region. Hyperventilation and photic stimulation were not performed.   IMPRESSION: This study is within normal limits. No seizures or epileptiform discharges were seen throughout the recording. Priyanka Barbra Sarks    Assessment/Plan: Brain tumors: I have discussed the situation with the patient and his partner.  I have told him that given his history I suspect these are melanoma metastasis, on the left of which has bled.  We have discussed the various treatment options including doing nothing, stereotactic brain biopsy, empiric treatment, and a left craniotomy for resection of the bigger lesion.  We have discussed that this tumor is not ideally located, i.e. an eloquent cortex but is fairly large.  Under circumstances I think the best option is a left craniotomy for resection of the tumor.  We have discussed the procedure.  We discussed the risks including risk  of anesthesia, hemorrhage, infection, seizure, incomplete tumor  resection, medical risks, increasing neurologic deficits, etc.  I have answered all their questions.  He has decided proceed with surgery.  We will tentatively plan this for next Wednesday.  LOS: 2 days     Ophelia Charter 08/05/2020, 10:45 AM

## 2020-08-05 NOTE — Progress Notes (Signed)
  Speech Language Pathology Treatment: Dysphagia;Cognitive-Linquistic  Patient Details Name: Bryan Hobbs MRN: [id number] DOB: January 30, 1951 Today's Date: 08/05/2020 Time: 3704-8889 SLP Time Calculation (min) (ACUTE ONLY): 35 min  Assessment / Plan / Recommendation Clinical Impression  SLP followed up for diet tolerance and upgraded PO trials. Pt reports good tolerance of diet. Assessed with regular textures and thin liquids. No overt difficulty exhibited. Recommend diet advancement to regular textures. Pt agreeable to plan. Pt with fluent communication. He continues to compensate with language with self awareness of errors in speech including paraphasias however remains highly intelligible. Pt reports surgery planned for next week. Will continue to follow.    HPI HPI: Pt is a 70 yo male presenting with aphasia and slurred speech. CT Head showed a 3.4 x 2.6 x 2.8 cm lesion in the left posterior frontal and parietal lobe with acute hemorrhage, and another 1.4 cm lesion in the posterior right parietal lobe. Concern is for metastatic lesions, with additional w/u underway. PMH includes: HTN, melanoma x3 s/p surgery, recent dental work (~3 weeks PTA, after which mild changes in his speech may have been noted)      SLP Plan  Continue with current plan of care       Recommendations  Diet recommendations: Regular;Thin liquid Liquids provided via: Cup;Straw Medication Administration: Whole meds with liquid Supervision: Patient able to self feed Compensations: Slow rate;Small sips/bites Postural Changes and/or Swallow Maneuvers: Seated upright 90 degrees;Upright 30-60 min after meal                Oral Care Recommendations: Oral care BID Follow up Recommendations: Other (comment) (TBD) SLP Visit Diagnosis: Aphasia (R47.01);Dysphagia, unspecified (R13.10) Plan: Continue with current plan of care       [place], CCC-SLP Acute Rehabilitation Services    08/05/2020, 11:35 AM

## 2020-08-05 NOTE — Evaluation (Signed)
Physical Therapy Evaluation Patient Details Name: Bryan Hobbs MRN: [id number] DOB: 05/26/1950 Today's Date: 08/05/2020   History of Present Illness  This 70 y.o. male admitted 4/18 with speech difficulty.  CT showed lesion in the Lt posterior frontal and parietal lobe with acute hemorrhage, and another lesion in the posterior Rt parietal lobe which are likely metastases from unknown origin.  MRI of abdomen showed PIRADS category V lesion which is highly suspicious for CA.  However, it is felt that it is unlikley that this is the primary site and appears that brain mets may be melanoma mets. PMH: h/o melanoma, Hepatitis A, HTN, sleep apnea    Clinical Impression  Pt presents with condition above and deficits mentioned below, see PT Problem List. PTA, he was living with his husband in an apartment with several steps to negotiate and was independent with all functional mobility without AD/AE. Currently, pt is requiring min guard-supervision for all functional mobility and benefiting from UE support of a RW for increased stability as he demonstrates R leg weakness, mild imbalance, and decreased R knee proprioception. In addition, he displays some cognitive deficits, such as impulsivity and difficulty with memory in recalling how to spell words or find letters on his phone keyboard. He is planning to have surgery next Wed for resection of tumors. Based on today's eval, recommend Outpatient PT and RW, however, anticipate that may change post op. Will continue to follow acutely.     Follow Up Recommendations Outpatient PT;Supervision/Assistance - 24 hour (initially)    Equipment Recommendations  Rolling walker with 5" wheels    Recommendations for Other Services       Precautions / Restrictions Precautions Precautions: Fall Precaution Comments: impaired sensation and mild weakness R leg; intermittent R knee hyperextension Restrictions Weight Bearing Restrictions: No      Mobility  Bed  Mobility               General bed mobility comments: Pt sitting up in chair    Transfers Overall transfer level: Needs assistance Equipment used: Rolling walker (2 wheeled);None Transfers: Sit to/from Stand Sit to Stand: Supervision         General transfer comment: supervision for safety, no overt LOB. Needs repeated cues for proper placement of RW and hands for safety purposes.  Ambulation/Gait Ambulation/Gait assistance: Min guard Gait Distance (Feet): 200 Feet (x2 bouts of ~25 ft > ~200 ft) Assistive device: Rolling walker (2 wheeled) Gait Pattern/deviations: Step-through pattern;Decreased stride length;Decreased dorsiflexion - right Gait velocity: reduced Gait velocity interpretation: 1.31 - 2.62 ft/sec, indicative of limited community ambulator General Gait Details: Pt with R knee instability anterior <> posterior with intermittent hyperextension initially, which improved when pt educated on what was noted and to control the knee to protect it. Pt with intermittent poor R foot clearance. Min guard for safety. Pt can be impulsive to lift RW or push it lateral or distal to him if he thinks he does not need it for some tasks and thus was educated on its proper position in relation to his body with all mobility, good carryover.  Stairs            Wheelchair Mobility    Modified Rankin (Stroke Patients Only) Modified Rankin (Stroke Patients Only) Pre-Morbid Rankin Score: No symptoms Modified Rankin: Moderately severe disability     Balance Overall balance assessment: Needs assistance Sitting-balance support: Feet supported Sitting balance-Leahy Scale: Good     Standing balance support: No upper extremity supported;Bilateral upper extremity supported;Single extremity  supported;During functional activity Standing balance-Leahy Scale: Fair Standing balance comment: able to maintain static standing with supervision without UE support but benefits from UE support  with dynamic tasks for safety.                             Pertinent Vitals/Pain Pain Assessment: Faces Faces Pain Scale: No hurt Pain Intervention(s): Monitored during session    Home Living Family/patient expects to be discharged to:: Private residence Living Arrangements: Spouse/significant other Available Help at Discharge: Family Type of Home: Apartment Home Access: Stairs to Games developer of Steps: sidewalk > 2 stairs > sidewalk > 2 stairs then entry, which contains elevator Home Layout: One level (with sunken living room) Home Equipment: None Additional Comments: Lives with his spouse, [place] in [place], [place].  They live in an apartment with an elevator, but apartment has a sunken living room with 2 steps.  Spouse works    Prior Function Level of Independence: Independent         Comments: Pt is a retired Environmental education officer, and an Research scientist (medical) Dominance   Dominant Hand: Left    Extremity/Trunk Assessment   Upper Extremity Assessment Upper Extremity Assessment: Defer to OT evaluation    Lower Extremity Assessment Lower Extremity Assessment: RLE deficits/detail RLE Deficits / Details: MMT scores of 4+ to 5, but noted decreased hamstring, anterior tibialis, and quads strength with mobility through instability and intermittent hyperextension at knee and intermittent poor foot clearance. RLE Sensation: decreased proprioception (at knee through no acknowledgement that he had hyperextended it several times; but intact when formally tested dynamic proprioception at ankle, did not test knee; denies numbness/tingling) RLE Coordination: decreased fine motor;decreased gross motor (dysmetria noted)    Cervical / Trunk Assessment Cervical / Trunk Assessment: Normal  Communication   Communication: Expressive difficulties  Cognition Arousal/Alertness: Awake/alert Behavior During Therapy: Impulsive Overall Cognitive Status:  Impaired/Different from baseline Area of Impairment: Attention;Memory;Following commands;Safety/judgement;Awareness;Problem solving                   Current Attention Level: Sustained Memory: Decreased short-term memory Following Commands: Follows one step commands consistently;Follows multi-step commands inconsistently Safety/Judgement: Decreased awareness of safety;Decreased awareness of deficits Awareness: Anticipatory Problem Solving: Difficulty sequencing;Requires verbal cues General Comments: Pt needing assistance to spell "producers" with phone and even handed phone to therapist to finish the words "movie clip walker" as he was having difficulty remembering which letter came next or where the letters were on the keyboard. Pt can be impulsive and distractable, needing cues to sequence and maintain safety, but good carryover once educated. Pt very pleasant and motivated to participate.      General Comments General comments (skin integrity, edema, etc.): Husband present during session    Exercises     Assessment/Plan    PT Assessment Patient needs continued PT services  PT Problem List Decreased strength;Decreased activity tolerance;Decreased balance;Decreased mobility;Decreased coordination;Decreased cognition;Decreased knowledge of use of DME;Decreased safety awareness;Impaired sensation       PT Treatment Interventions DME instruction;Gait training;Stair training;Functional mobility training;Therapeutic activities;Therapeutic exercise;Balance training;Neuromuscular re-education;Cognitive remediation;Patient/family education    PT Goals (Current goals can be found in the Care Plan section)  Acute Rehab PT Goals Patient Stated Goal: To have surgery and get back home PT Goal Formulation: With patient/family Time For Goal Achievement: 08/19/20 Potential to Achieve Goals: Good    Frequency Min 3X/week   Barriers to discharge  Co-evaluation                AM-PAC PT "6 Clicks" Mobility  Outcome Measure Help needed turning from your back to your side while in a flat bed without using bedrails?: A Little Help needed moving from lying on your back to sitting on the side of a flat bed without using bedrails?: A Little Help needed moving to and from a bed to a chair (including a wheelchair)?: A Little Help needed standing up from a chair using your arms (e.g., wheelchair or bedside chair)?: A Little Help needed to walk in hospital room?: A Little Help needed climbing 3-5 steps with a railing? : A Little 6 Click Score: 18    End of Session Equipment Utilized During Treatment: Gait belt Activity Tolerance: Patient tolerated treatment well Patient left: in chair Nurse Communication: Mobility status;Other (comment) (RN reported pt was ok without chair alarm) PT Visit Diagnosis: Unsteadiness on feet (R26.81);Other abnormalities of gait and mobility (R26.89);Muscle weakness (generalized) (M62.81);Difficulty in walking, not elsewhere classified (R26.2);Other symptoms and signs involving the nervous system (R29.898);Apraxia (R48.2)    Time: 0601-5615 PT Time Calculation (min) (ACUTE ONLY): 29 min   Charges:   PT Evaluation $PT Eval Moderate Complexity: 1 Mod PT Treatments $Gait Training: 8-22 mins        Moishe Spice, PT, DPT Acute Rehabilitation Services  Pager: [phone] Office: [phone]   Orvan Falconer 08/05/2020, 6:19 PM

## 2020-08-05 NOTE — Progress Notes (Addendum)
   Subjective:   Patient with significant improvement in his speech compared to yesterday. Otherwise no new complaints. He is comfortable with plan for resection by neurosurgery on Wednesday.  Objective:  Vital signs in last 24 hours: Vitals:   08/04/20 1600 08/04/20 2027 08/05/20 0014 08/05/20 0350  BP: 116/80 112/70 115/66 112/76  Pulse: 78  60 (!) 57  Resp: 14     Temp: 97.9 F (36.6 C) 98.1 F (36.7 C) 98.1 F (36.7 C) 98.3 F (36.8 C)  TempSrc: Oral Oral Oral   SpO2: 98% 98% 95% 93%  Weight:      Height:        Physical Exam Constitutional: no acute distress Head: atraumatic ENT: external ears normal Eyes: EOMI Pulmonary: effort normal Abdominal: flat Skin: warm and dry, thorough skin exam with no lesions suspicious for primary melanoma but is noted to have numerous diffuse nevi, actinic keratosis, seborrheic keratosis, and hemangiomas Neurological: alert. Speech much improve today, there are no gross deficits at this time. RUE and LUE motor strength is 4/5 compared to left Psychiatric: normal mood and affect  Assessment/Plan: Bryan Hobbs is a 70 y.o. male with hx of hypertension and melanoma presenting for acute onset aphasia, CT head concerning for 2 new masses with hemorrhage concerning for metastatic melanoma. Prostate MRI also highly suspicious for cancer but unlikely to be the primary with low PSA and no other mets on pan scan.   Active Problems:   Brain lesion   Aphasia   Elevated PSA  Brain mass x2 with hemorrhage, concern for metastatic melanoma Aphasia, right upper extremity weakness, right lower extremity weakness Patient presenting with sudden onset aphasia and left facial droop. CT and MRI show two mass lesions involving his posterior left frontal lobe and parietal lobe with acute hemorrhage in one of the lesions. Hx of melanoma x3 excised in  1992, 2012, 2017. CT abd, pelvis, and chest without evidence of primary tumor. No know immunocompromising  conditions, HIV negative.   - Neurology and neurosurgery following - Plan for resection on Wednesday - Oncology consulted - Status post Decadron for cerebral edema - Keppra and Ativan per neurology - Neuro checks  Prostate cancer, high suspicion Patient had PSA of 7.4 in November, then 7.1 in February.  Had prostate biopsy scheduled for next month.  MRI prostate completed this admission which showed PIRADS category V lesion, so very highly suspicious for cancer. Pan scan did not show any other metastases.  Discussed with urology, who felt that with his relatively low PSA and benign pan scan, it is exceedingly unlikely that his brain lesions are prostate mets. It is also very unlikely that the prostate lesion represents a metastasis from a common primary as the brain lesions.  - f/u repeat PSA - will reconsult urology if anything changes - follow up outpatient for prostate biopsy  Fungal intertrigo Noted redness of inguinal folds with satellite lesions. - s/p fluconazole  CKD stage IIIa Creatinine 1.5 on admission, which seems close to his baseline. - Avoid nephrotoxins - Follow-up microalbumin creatinine ratio   Diet: Dysphagia 3 IVF:  none VTE:  SCD Prior to Admission Living Arrangement:  home Anticipated Discharge Location:  Home Barriers to Discharge:  Resection of brain mass planned Dispo: TBD  Bryan Au, MD 08/05/2020, 7:04 AM Pager: [phone] After 5pm on weekdays and 1pm on weekends: On Call pager [phone]

## 2020-08-05 NOTE — Consult Note (Signed)
This note was stopped before completion; see completed consult note same date

## 2020-08-05 NOTE — Evaluation (Signed)
Occupational Therapy Evaluation Patient Details Name: Bryan Hobbs MRN: [id number] DOB: March 27, 1951 Today's Date: 08/05/2020    History of Present Illness This 70 y.o. male admitted with speech difficulty.  CT showed lesion in the Lt posterior frontal and parietal lobe with acute hemorrhage, and another lesion in the posterior Rt parietal lobe which are likely metastases from unknown origin.  MRI of abdomen showed PIRADS category V lesion which is highly suspicious for CA.  However, it is felt that it is unlikley that this is the primary site and appears that brain mets may be melanoma mets. PMH: h/o melanoma, Hepatitis A, HTN, sleep apnea   Clinical Impression   Pt admitted with above. He demonstrates the below listed deficits and will benefit from continued OT to maximize safety and independence with BADLs.  Pt presents to OT with mild Rt UE weakness, impaired stereognosis Rt hand as well as decreased [place] Rt UE.  He demonstrates impaired balance, and impaired communication skills.  He is able to perform ADLs with set up/supervision.   He is planning to have surgery next Wed for resection of tumors.  In the interim he will benefit from HEP for Rt UE.  Based on today's eval, recommend OPOT and RW, however, anticipate that may change post op.  Will follow.       Follow Up Recommendations  Outpatient OT;Supervision - Intermittent    Equipment Recommendations  None recommended by OT    Recommendations for Other Services       Precautions / Restrictions Precautions Precautions: Fall Precaution Comments: impaired sensation and mild weakness Rt LE      Mobility Bed Mobility               General bed mobility comments: Pt sitting up in chair    Transfers Overall transfer level: Needs assistance Equipment used: Rolling walker (2 wheeled);None Transfers: Sit to/from [employer] to Stand: Supervision Stand pivot transfers: Supervision       General  transfer comment: supervision for safety    Balance Overall balance assessment: Needs assistance Sitting-balance support: Feet supported Sitting balance-Leahy Scale: Good     Standing balance support: No upper extremity supported Standing balance-Leahy Scale: Fair Standing balance comment: able to maintain static standing with supervision                           ADL either performed or assessed with clinical judgement   ADL Overall ADL's : Needs assistance/impaired Eating/Feeding: Modified independent;Sitting   Grooming: Wash/dry hands;Wash/dry face;Oral care;Supervision/safety;Standing   Upper Body Bathing: Set up;Sitting   Lower Body Bathing: Supervison/ safety;Sit to/from stand   Upper Body Dressing : Set up;Supervision/safety;Sitting   Lower Body Dressing: Supervision/safety;Sit to/from stand   Toilet Transfer: Supervision/safety;Ambulation;Comfort height toilet;Grab bars;RW   Toileting- Clothing Manipulation and Hygiene: Supervision/safety;Sit to/from stand       Functional mobility during ADLs: Supervision/safety;Rolling walker       Vision Baseline Vision/History: Wears glasses Wears Glasses: Reading only Patient Visual Report: No change from baseline Additional Comments: Pt reports he has been reading the [place] on his computer.  He denies difficulty comprehending what he is reading, denies blurriness, distractability, or decreased speed     Perception Perception Perception Tested?: Yes   Praxis Praxis Praxis tested?: Within functional limits    Pertinent Vitals/Pain Pain Assessment: No/denies pain     Hand Dominance Left   Extremity/Trunk Assessment Upper Extremity Assessment Upper Extremity Assessment: RUE deficits/detail  RUE Deficits / Details: Grossly 4/5.  Pt with decreased opposition, frequently drops items; decreased stereognosis RUE Sensation: decreased proprioception RUE Coordination: decreased fine motor   Lower  Extremity Assessment Lower Extremity Assessment: Defer to PT evaluation   Cervical / Trunk Assessment Cervical / Trunk Assessment: Normal   Communication Communication Communication: Expressive difficulties   Cognition Arousal/Alertness: Awake/alert Behavior During Therapy: WFL for tasks assessed/performed Overall Cognitive Status: Within Functional Limits for tasks assessed                                 General Comments: Appears grossly [facility]   General Comments       Exercises     Shoulder Instructions      Home Living Family/patient expects to be discharged to:: Private residence Living Arrangements: Spouse/significant other Available Help at Discharge: Family Type of Home: Apartment Home Access: Stairs to enter;Elevator Entrance Stairs-Number of Steps: sidewalk > 2 stairs > sidewalk > 2 stairs then entry, which contains elevator   Home Layout: One level (with sunken living room)     Bathroom Shower/Tub: Teacher, early years/pre: Standard     Home Equipment: None   Additional Comments: Lives his spouse, Bryan Hobbs in [place], [place].  They live in an apartment with an elevator, but apartment has a sunken living room with 2 steps.  Spouse works  Lives With: Spouse    Prior Functioning/Environment Level of Independence: Independent        Comments: Pt is a retired Environmental education officer, and an Event organiser        OT Problem List: Decreased strength;Impaired balance (sitting and/or standing);Decreased coordination;Decreased knowledge of use of DME or AE;Impaired sensation;Impaired UE functional use      OT Treatment/Interventions: Self-care/ADL training;Neuromuscular education;DME and/or AE instruction;Therapeutic activities;Patient/family education;Balance training    OT Goals(Current goals can be found in the care plan section) Acute Rehab OT Goals Patient Stated Goal: To have surgery and get back home OT Goal Formulation: With patient Time  For Goal Achievement: 08/19/20 Potential to Achieve Goals: Good ADL Goals Additional ADL Goal #1: Pt will be independent with HEP for Rt hand coordination  OT Frequency: Min 2X/week   Barriers to D/C:            Co-evaluation              AM-PAC OT "6 Clicks" Daily Activity     Outcome Measure Help from another person eating meals?: None Help from another person taking care of personal grooming?: A Little Help from another person toileting, which includes using toliet, bedpan, or urinal?: A Little Help from another person bathing (including washing, rinsing, drying)?: A Little Help from another person to put on and taking off regular upper body clothing?: A Little Help from another person to put on and taking off regular lower body clothing?: A Little 6 Click Score: 19   End of Session Equipment Utilized During Treatment: Rolling walker Nurse Communication: Mobility status  Activity Tolerance: Patient tolerated treatment well Patient left: in chair;with call bell/phone within reach  OT Visit Diagnosis: Unsteadiness on feet (R26.81);Cognitive communication deficit (R41.841)                Time: 8938-1017 OT Time Calculation (min): 22 min Charges:  OT General Charges $OT Visit: 1 Visit OT Evaluation $OT Eval Moderate Complexity: 1 Mod  Jeshua Ransford C., OTR/L Acute Rehabilitation Services Pager [phone] Office [phone]  Lucille Passy M 08/05/2020, 3:17 PM

## 2020-08-06 LAB — BASIC METABOLIC PANEL
Anion gap: 8 (ref 5–15)
BUN: 31 mg/dL — ABNORMAL HIGH (ref 8–23)
CO2: 25 mmol/L (ref 22–32)
Calcium: 9 mg/dL (ref 8.9–10.3)
Chloride: 110 mmol/L (ref 98–111)
Creatinine, Ser: 1.58 mg/dL — ABNORMAL HIGH (ref 0.61–1.24)
GFR, Estimated: 47 mL/min — ABNORMAL LOW (ref >60)
Glucose, Bld: 97 mg/dL (ref 70–99)
Potassium: 4.2 mmol/L (ref 3.5–5.1)
Sodium: 143 mmol/L (ref 135–145)

## 2020-08-06 LAB — CBC
HCT: 47.7 % (ref 39.0–52.0)
Hemoglobin: 15.6 g/dL (ref 13.0–17.0)
MCH: 30.2 pg (ref 26.0–34.0)
MCHC: 32.7 g/dL (ref 30.0–36.0)
MCV: 92.4 fL (ref 80.0–100.0)
Platelets: 203 10*3/uL (ref 150–400)
RBC: 5.16 MIL/uL (ref 4.22–5.81)
RDW: 12.7 % (ref 11.5–15.5)
WBC: 8.8 10*3/uL (ref 4.0–10.5)
nRBC: 0 % (ref 0.0–0.2)

## 2020-08-06 LAB — MICROALBUMIN / CREATININE URINE RATIO
Creatinine, Urine: 107.9 mg/dL
Microalb Creat Ratio: <3 mg/g creat (ref 0–29)
Microalb, Ur: <3 ug/mL — ABNORMAL HIGH

## 2020-08-06 MED ORDER — DEXAMETHASONE SODIUM PHOSPHATE 4 MG/ML IJ SOLN
4.0000 mg | Freq: Three times a day (TID) | INTRAMUSCULAR | Status: DC
  Administered 2020-08-06 – 2020-08-10 (×15): 4 mg via INTRAVENOUS
  Filled 2020-08-06 (×16): qty 1

## 2020-08-06 NOTE — Progress Notes (Signed)
Subjective: The patient is alert and pleasant.  His partner is at the bedside.  Objective: Vital signs in last 24 hours: Temp:  [97.6 F (36.4 C)-98.4 F (36.9 C)] 98.1 F (36.7 C) (04/22 0422) Pulse Rate:  [56-72] 58 (04/22 0422) Resp:  [16-19] 16 (04/22 0422) BP: (106-111)/(72-84) 107/72 (04/22 0422) SpO2:  [92 %-96 %] 92 % (04/22 0422) Estimated body mass index is 34.25 kg/m as calculated from the following:   Height as of this encounter: 5\' 7"  (1.702 m).   Weight as of this encounter: 99.2 kg.   Intake/Output from previous day: 04/21 0701 - 04/22 0700 In: 483 [P.O.:480; I.V.:3] Out: -  Intake/Output this shift: Total I/O In: 3 [I.V.:3] Out: -   Physical exam the patient is alert and oriented.  He is dysphasic.  He is moving all 4 extremities.  Lab Results: Recent Labs    08/05/20 0748 08/06/20 0455  WBC 11.7* 8.8  HGB 15.6 15.6  HCT 47.9 47.7  PLT 214 203   BMET Recent Labs    08/05/20 0748 08/06/20 0455  NA 140 143  K 4.2 4.2  CL 107 110  CO2 24 25  GLUCOSE 108* 97  BUN 32* 31*  CREATININE 1.76* 1.58*  CALCIUM 9.1 9.0    Studies/Results: MR BRAIN W WO CONTRAST  Result Date: 08/04/2020 CLINICAL DATA:  Stroke follow-up. EXAM: MRI HEAD WITHOUT AND WITH CONTRAST TECHNIQUE: Multiplanar, multiecho pulse sequences of the brain and surrounding structures were obtained without and with intravenous contrast. CONTRAST:  10mL GADAVIST GADOBUTROL 1 MMOL/ML IV SOLN COMPARISON:  None. FINDINGS: Brain: Approximately 3.4 by 3.1 by 3.4 cm hemorrhagic mass in the posterior left frontal lobe with areas of internal T2 hypointensity, there a genius T1 hyperintensity and susceptibility artifact. There is irregular and wispy peripheral enhancement and restricted diffusion, compatible with hypercellularity. The mass abuts the overlying left frontal dura. dditional 1.5 by 0.6 by 0.9 cm enhancing mass within the right parietal lobe, which also demonstrates some faint peripheral  restricted diffusion, compatible with hypercellularity. Both lesions demonstrate exuberant surrounding vasogenic edema. Resulting mass effect on the left lateral ventricle which is partially effaced. Additionally, there is approximately 3 mm of rightward midline shift at the foramen of Monro. Small focus of nonenhancing left frontal periventricular restricted diffusion (series 450, image 33). Additional scattered mild T2/FLAIR hyperintensities within the white matter, nonspecific but likely related to chronic microvascular ischemic disease. No hydrocephalus. Vascular: Major arterial flow voids are maintained at the skull base. Skull and upper cervical spine: Normal marrow signal. Sinuses/Orbits: Mucosal thickening of ethmoid air cells, frontal sinuses and sphenoid sinuses. Unremarkable orbits. Other: Heterogeneous T1 hyperintensity involving the left nasopharynx. No mastoid effusions. IMPRESSION: 1. Large hemorrhagic and peripherally enhancing heterogeneous mass in the left posterior frontal lobe and additional enhancing mass in the right parietal lobe, as detailed above. Given multifocality, findings are concerning for metastatic disease. Multicentric high-grade glioma is a less likely differential consideration. 2. Exuberant surrounding edema. Resulting mass effect with partial left lateral ventricular effacement and 3 mm of rightward midline shift. 3. Small focus of nonenhancing left frontal periventricular restricted diffusion, which could represent acute infarct or an additional nonenhancing lesion. Recommend attention on close follow-up. 4. Heterogeneous T1 hyperintensity involving the left nasopharynx, which is indeterminate but potentially represents proteinaceous retention cysts. This area is amenable to direct inspection. Electronically Signed   By: Margaretha Sheffield MD   On: 08/04/2020 13:18   MR PROSTATE W WO CONTRAST  Result Date: 08/04/2020 CLINICAL  DATA:  Enlarged prostate with elevated PSA by  report. EXAM: MR PROSTATE WITHOUT AND WITH CONTRAST TECHNIQUE: Multiplanar multisequence MRI images were obtained of the pelvis centered about the prostate. Pre and post contrast images were obtained. CONTRAST:  26mL GADAVIST GADOBUTROL 1 MMOL/ML IV SOLN COMPARISON:  None. FINDINGS: Prostate: Transitional zone: (Image 15, series 18) homogeneous low T2 signal with lenticular appearance along the surgical capsule of the RIGHT posterolateral transitional zone near the base to mid of the RIGHT prostate gland showing very dark appearance on the ADC map and bright appearance on diffusion-weighted imaging PIRADS category 5 Signs of BPH elsewhere but with other areas of restricted diffusion most notably in the LEFT anterior mid gland. This does not correspond to a homogeneous area of low T2 signal and is therefore classified as PIRADS category 3 is that also is contained within a BPH nodule (image 13 of the ADC map). LEFT mid gland anterior transitional zone Peripheral zone: Linear and wedge-shaped areas of T2 hypointensity throughout the peripheral zone compatible with prior prostatitis. No signs of post biopsy hemorrhage. Volume: 51.3 cc Transcapsular spread:  Absent Seminal vesicle involvement: Absent Neurovascular bundle involvement: Absent Pelvic adenopathy: Absent Bone metastasis: Absent Other findings: None IMPRESSION: 1. PIRADS category 5 lesion in the RIGHT posterolateral transitional zone near the base to mid of the RIGHT prostate gland. Highly suspicious for prostate neoplasm. 2. PIRADS category 3 lesion in the LEFT anterior mid gland transitional zone. 3. Findings of prior prostatitis. 4. Signs of BPH. Electronically Signed   By: Zetta Bills M.D.   On: 08/04/2020 15:33    Assessment/Plan: Brain tumors: I have again discussed the situation with the patient.  I have answered all his questions regarding surgery.  He wants to proceed with surgery.  We are planning for Wednesday.  LOS: 3 days     Bryan Hobbs 08/06/2020, 6:54 AM

## 2020-08-06 NOTE — Progress Notes (Signed)
Occupational Therapy Treatment Patient Details Name: Bryan Hobbs MRN: [id number] DOB: August 05, 1950 Today's Date: 08/06/2020    History of present illness This 70 y.o. male admitted 4/18 with speech difficulty.  CT showed lesion in the Lt posterior frontal and parietal lobe with acute hemorrhage, and another lesion in the posterior Rt parietal lobe which are likely metastases from unknown origin.  MRI of abdomen showed PIRADS category V lesion which is highly suspicious for CA.  However, it is felt that it is unlikley that this is the primary site and appears that brain mets may be melanoma mets. PMH: h/o melanoma, Hepatitis A, HTN, sleep apnea   OT comments  Pt provided with HEP for Rt hand coordination.   He was able to return demonstration of the exercises.  Will continue to follow.   Follow Up Recommendations  Outpatient OT;Supervision - Intermittent    Equipment Recommendations  None recommended by OT    Recommendations for Other Services      Precautions / Restrictions Precautions Precautions: Fall Precaution Comments: impaired sensation and mild weakness R leg; intermittent R knee hyperextension       Mobility Bed Mobility                    Transfers                      Balance                                           ADL either performed or assessed with clinical judgement   ADL                                               Vision       Perception     Praxis      Cognition Arousal/Alertness: Awake/alert Behavior During Therapy: WFL for tasks assessed/performed Overall Cognitive Status: Within Functional Limits for tasks assessed                                 General Comments: Pt very gregarious.  Pt demonstrates difficulty with tasks that require language production both written and verbal - see SLP notes for details        Exercises Exercises: Other exercises Other  Exercises Other Exercises: Pt provided with a written HEP for [facility] Rt hand.  Reviewed it with he and his spouse and he was able to return demonstration.  translation of objects, finger movements superimposed on thumb movements, and finger extension and abduction exercises.   Shoulder Instructions       General Comments      Pertinent Vitals/ Pain       Pain Assessment: No/denies pain  Home Living                                          Prior Functioning/Environment              Frequency  Min 2X/week        Progress Toward Goals  OT Goals(current goals can now be  found in the care plan section)  Progress towards OT goals: Progressing toward goals     Plan Discharge plan remains appropriate    Co-evaluation                 AM-PAC OT "6 Clicks" Daily Activity     Outcome Measure   Help from another person eating meals?: None Help from another person taking care of personal grooming?: A Little Help from another person toileting, which includes using toliet, bedpan, or urinal?: A Little Help from another person bathing (including washing, rinsing, drying)?: A Little Help from another person to put on and taking off regular upper body clothing?: A Little Help from another person to put on and taking off regular lower body clothing?: A Little 6 Click Score: 19    End of Session    OT Visit Diagnosis: Unsteadiness on feet (R26.81);Cognitive communication deficit (J33.545)   Activity Tolerance     Patient Left     Nurse Communication          Time: 6256-3893 OT Time Calculation (min): 22 min  Charges: OT General Charges $OT Visit: 1 Visit OT Treatments $Neuromuscular Re-education: 8-22 mins  Nilsa Nutting OTR/L Acute Rehabilitation Services Pager [phone] Office [phone]    Lucille Passy M 08/06/2020, 6:53 PM

## 2020-08-06 NOTE — Progress Notes (Signed)
   Subjective:   Patient states he overall feels well.  Speech is improved compared to previous few days, similar to yesterday.  Thinks his right arm is somewhat weaker.  Objective:  Vital signs in last 24 hours: Vitals:   08/05/20 1559 08/05/20 1955 08/05/20 2353 08/06/20 0422  BP: 106/72 111/72 110/84 107/72  Pulse: 69 61 (!) 56 (!) 58  Resp: 18 16 19 16   Temp: 98.2 F (36.8 C) 98.1 F (36.7 C) 98.4 F (36.9 C) 98.1 F (36.7 C)  TempSrc: Oral Oral Oral Oral  SpO2: 92% 92% 93% 92%  Weight:      Height:        Physical Exam Constitutional: no acute distress Head: atraumatic ENT: external ears normal Eyes: EOMI Pulmonary: effort normal Abdominal: flat Skin: warm and dry, thorough skin exam with no lesions suspicious for primary melanoma but is noted to have numerous diffuse nevi, actinic keratosis, seborrheic keratosis, and hemangiomas Neurological: alert. Speech much improve today, there are still mild aphasic elements. RUE and LUE motor strength is 4/5 compared to left Psychiatric: normal mood and affect  Assessment/Plan: Bryan Hobbs is a 70 y.o. male with hx of hypertension and melanoma presenting for acute onset aphasia, CT head concerning for 2 new masses with hemorrhage concerning for metastatic melanoma. Prostate MRI also highly suspicious for cancer but unlikely to be the primary with low PSA and no other mets on pan scan.   Active Problems:   Brain lesion   Aphasia   Elevated PSA   Prostate mass  Brain mass x2 with hemorrhage, concern for metastatic melanoma Aphasia, right upper extremity weakness, right lower extremity weakness Patient presenting with sudden onset aphasia and left facial droop. CT and MRI show two mass lesions involving his posterior left frontal lobe and parietal lobe with acute hemorrhage in one of the lesions. Hx of melanoma x3 excised in  1992, 2012, 2017. CT abd, pelvis, and chest without evidence of primary tumor. No know  immunocompromising conditions, HIV negative.   - Neurology, neurosurgery, and oncology following - Plan for resection on Wednesday - Continue Decadron for cerebral edema - Keppra and Ativan per neurology - Neuro checks    Prostate cancer, high suspicion MRI prostate with PIRADS category V lesion. PSA 7.1 in February, 18.9 this admission. Spoke with urology who do not believe this is related as his pan scan did not show any mets, recommend outpatient biopsy. - follow up outpatient for prostate biopsy  CKD stage IIIa Creatinine 1.5 on admission, which seems close to his baseline. - Avoid nephrotoxins - Follow-up microalbumin creatinine ratio   Diet: Dysphagia 3 IVF:  none VTE:  SCD Prior to Admission Living Arrangement:  home Anticipated Discharge Location:  TBD Barriers to Discharge:  Resection of brain mass planned 4/27 Dispo: TBD  [name], MD 08/06/2020, 6:31 AM Pager: [phone] After 5pm on weekdays and 1pm on weekends: On Call pager [phone]

## 2020-08-06 NOTE — Progress Notes (Addendum)
PT Cancellation Note  Patient Details Name: Bryan Hobbs MRN: [id number] DOB: [birth year]   Cancelled Treatment:    Reason Eval/Treat Not Completed: Other (comment). Pt seen by PT walking around halls with RW without assistance. As pt is staying active and safe will defer treatment this date. Will follow-up another date as appropriate.   Moishe Spice, PT, DPT Acute Rehabilitation Services  Pager: [phone] Office: [place] 08/06/2020, 5:55 PM

## 2020-08-07 ENCOUNTER — Inpatient Hospital Stay (HOSPITAL_COMMUNITY): Payer: PRIVATE HEALTH INSURANCE

## 2020-08-07 DIAGNOSIS — I700 Atherosclerosis of aorta: Secondary | ICD-10-CM | POA: Diagnosis present

## 2020-08-07 DIAGNOSIS — I618 Other nontraumatic intracerebral hemorrhage: Secondary | ICD-10-CM

## 2020-08-07 DIAGNOSIS — C61 Malignant neoplasm of prostate: Secondary | ICD-10-CM

## 2020-08-07 DIAGNOSIS — N1831 Chronic kidney disease, stage 3a: Secondary | ICD-10-CM | POA: Diagnosis not present

## 2020-08-07 DIAGNOSIS — R4701 Aphasia: Secondary | ICD-10-CM | POA: Diagnosis not present

## 2020-08-07 LAB — BASIC METABOLIC PANEL
Anion gap: 10 (ref 5–15)
BUN: 32 mg/dL — ABNORMAL HIGH (ref 8–23)
CO2: 21 mmol/L — ABNORMAL LOW (ref 22–32)
Calcium: 9.5 mg/dL (ref 8.9–10.3)
Chloride: 107 mmol/L (ref 98–111)
Creatinine, Ser: 1.54 mg/dL — ABNORMAL HIGH (ref 0.61–1.24)
GFR, Estimated: 49 mL/min — ABNORMAL LOW (ref >60)
Glucose, Bld: 154 mg/dL — ABNORMAL HIGH (ref 70–99)
Potassium: 4.3 mmol/L (ref 3.5–5.1)
Sodium: 138 mmol/L (ref 135–145)

## 2020-08-07 LAB — CBC
HCT: 48.3 % (ref 39.0–52.0)
Hemoglobin: 16.1 g/dL (ref 13.0–17.0)
MCH: 30.2 pg (ref 26.0–34.0)
MCHC: 33.3 g/dL (ref 30.0–36.0)
MCV: 90.6 fL (ref 80.0–100.0)
Platelets: 231 10*3/uL (ref 150–400)
RBC: 5.33 MIL/uL (ref 4.22–5.81)
RDW: 12.5 % (ref 11.5–15.5)
WBC: 10.5 10*3/uL (ref 4.0–10.5)
nRBC: 0 % (ref 0.0–0.2)

## 2020-08-07 IMAGING — CT CT L SPINE W/ CM
3 series · 12 of 33 positions shown, 14 images · IV contrast (omnipaque)
Comparison: CT chest, abdomen, and pelvis [DATE]

CLINICAL DATA: Cauda equina syndrome. Brain mass is concerning for
metastatic melanoma.

EXAM:
CT LUMBAR SPINE WITH CONTRAST
TECHNIQUE: Multidetector CT imaging of the lumbar spine was performed with
intravenous contrast administration.
CONTRAST:  100mL OMNIPAQUE IOHEXOL 300 MG/ML  SOLN

[Series 3: l-spine 2.0 st · axial · 0.35mm/px · z∈[-204,-20]mm · 4 of 134 slices shown, 5 images]
[im 21/134  soft-tissue]
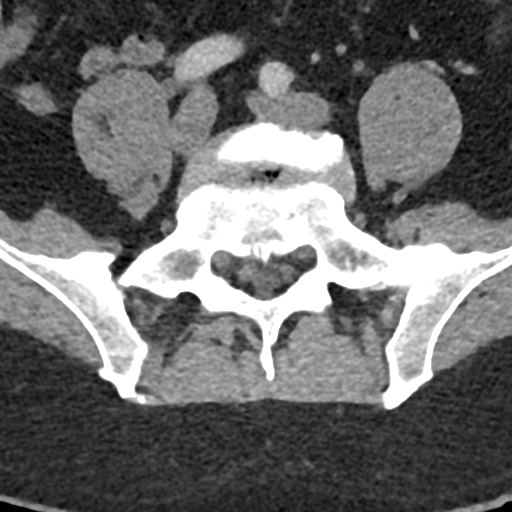
[im 21/134  bone]
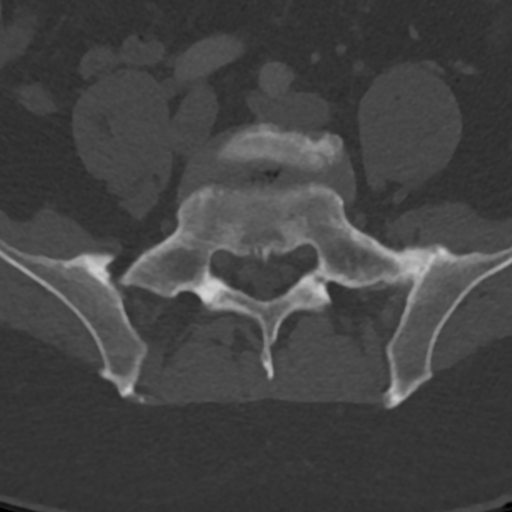
[im 52/134  bone]
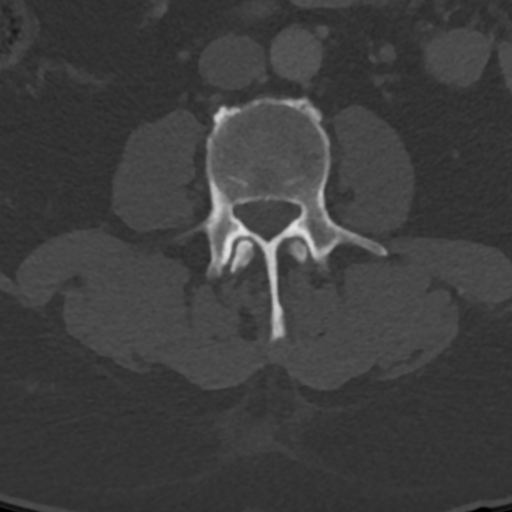
[im 82/134  bone]
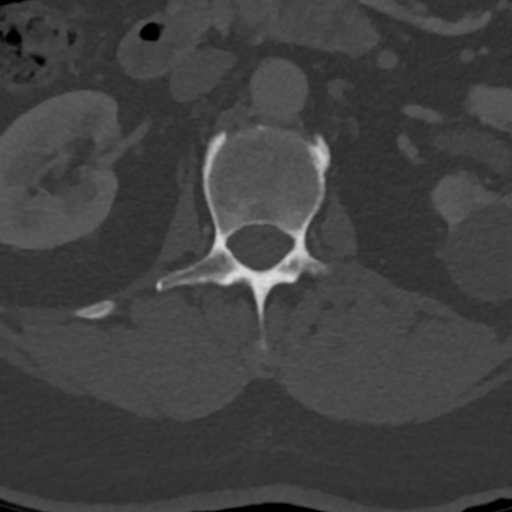
[im 113/134  bone]
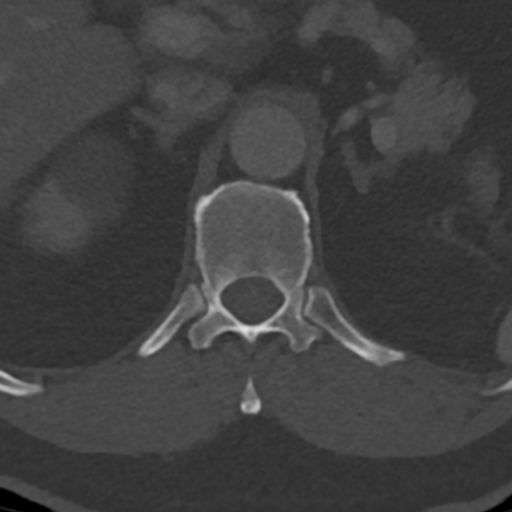

[Series 7: l-spine 2.0 cor · coronal · 0.39mm/px · 3 of 61 slices shown]
[im 13/61  bone]
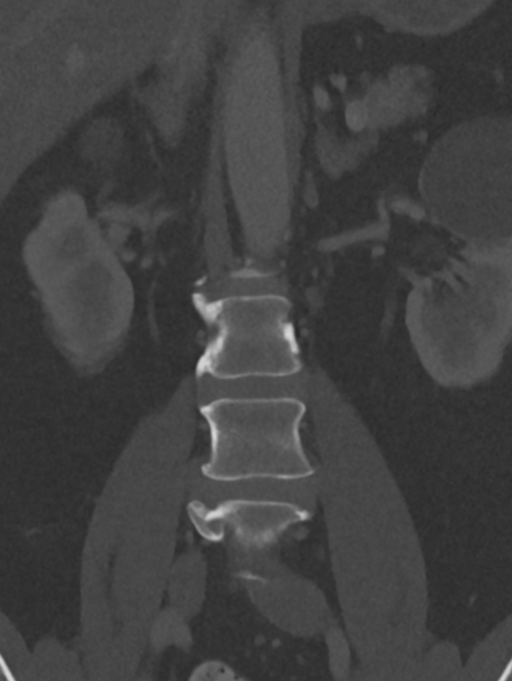
[im 25/61  bone]
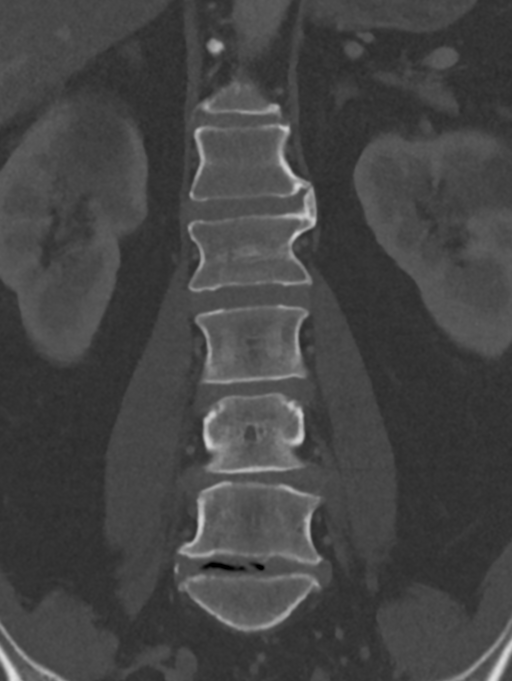
[im 37/61  bone]
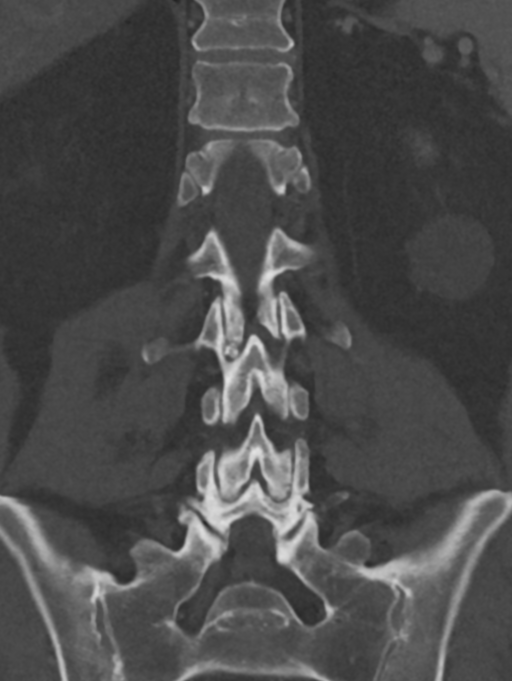

[Series 8: l-spine 2.0 sag · sagittal · 0.32mm/px · 5 of 84 slices shown, 6 images]
[im 28/84  bone]
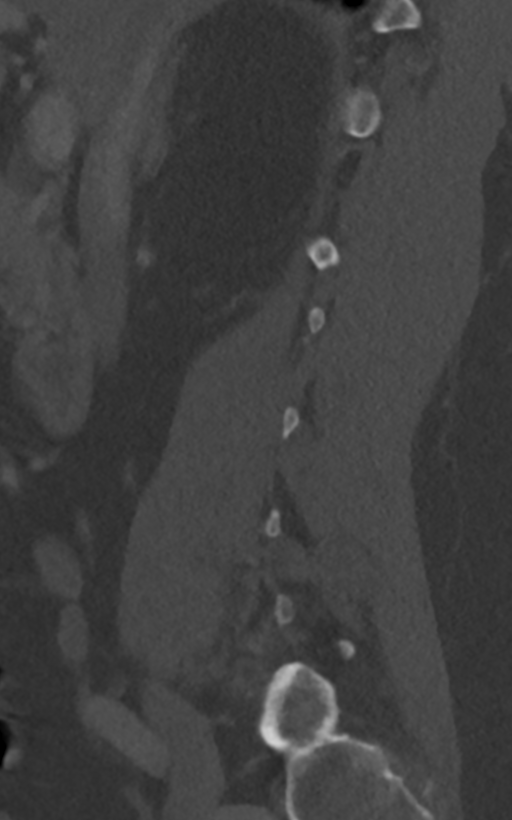
[im 35/84  bone]
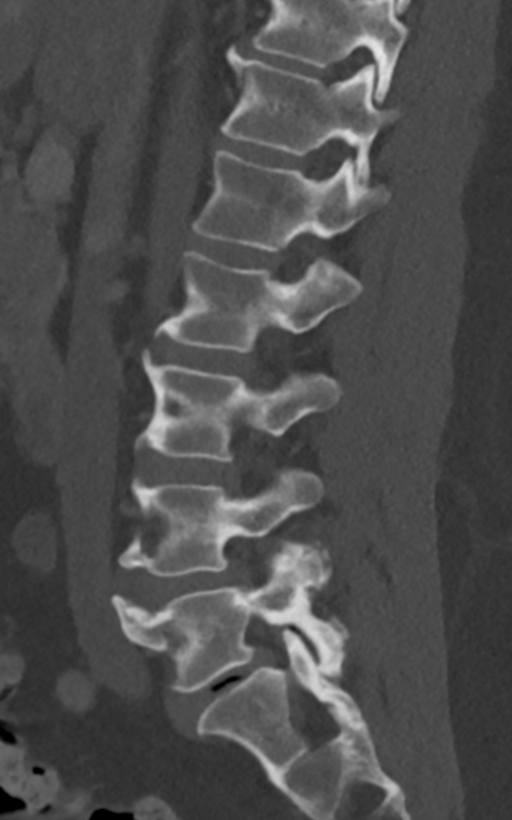
[im 42/84  soft-tissue]
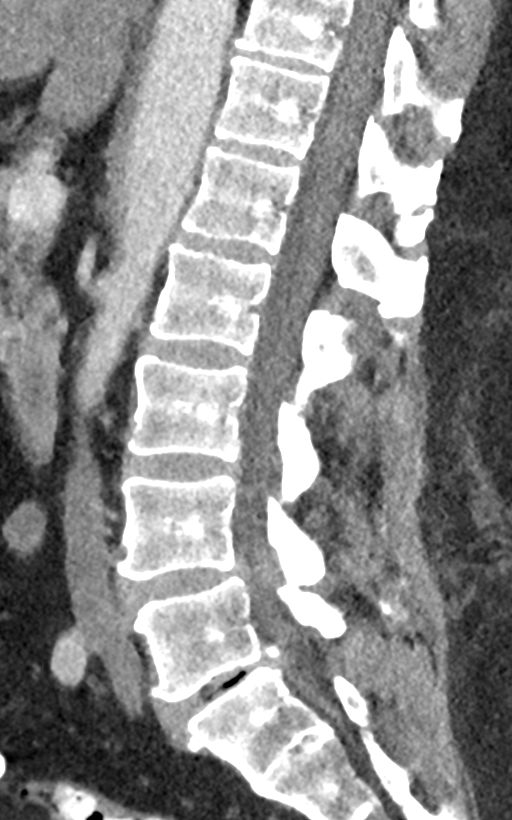
[im 42/84  bone]
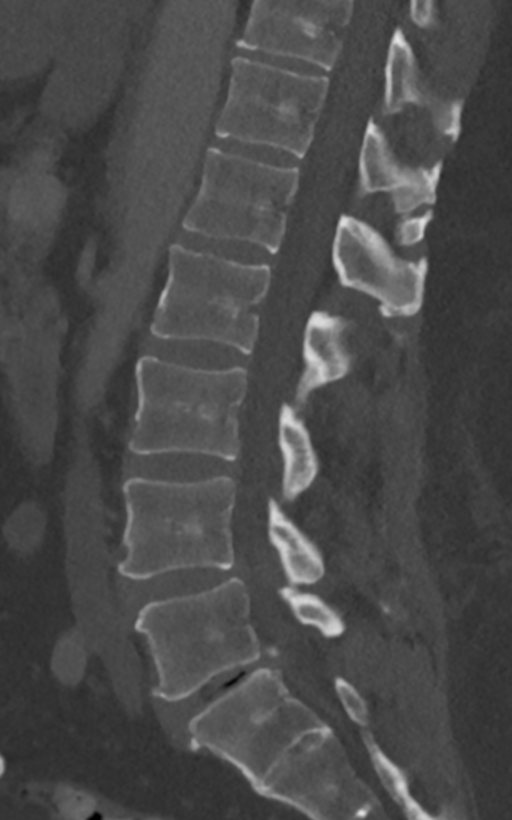
[im 49/84  bone]
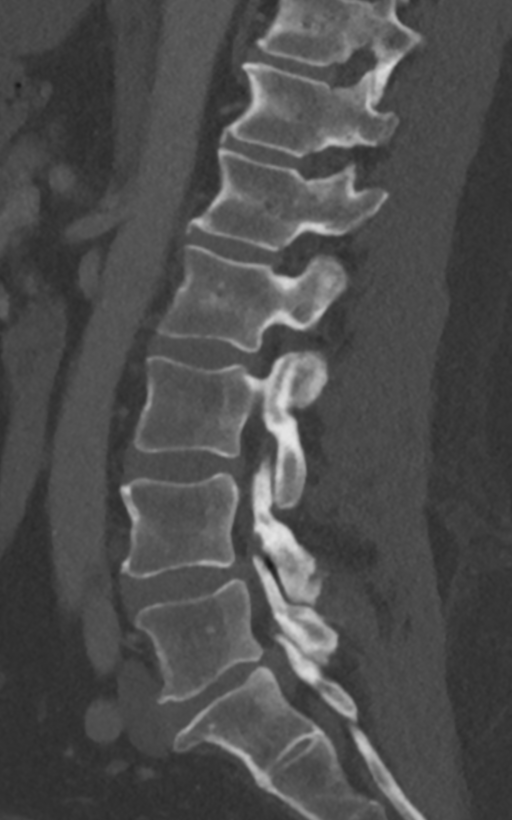
[im 56/84  bone]
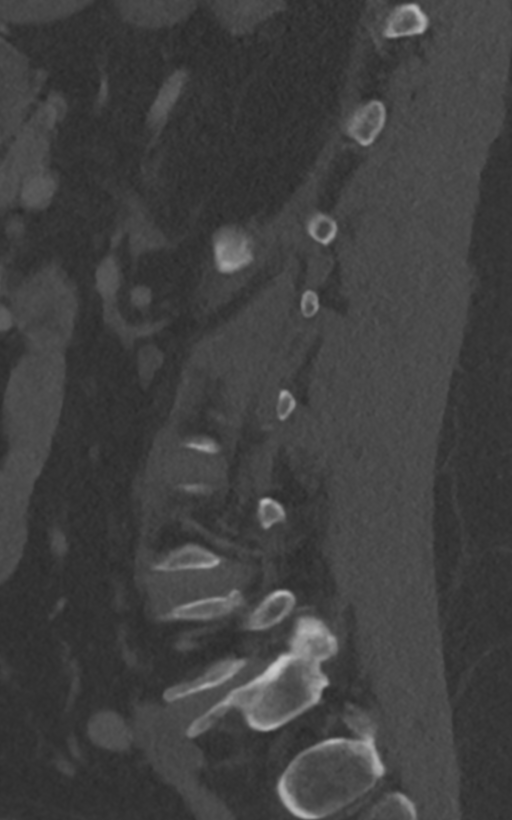

[12 of 33 positions shown; findings below may reference images not displayed]

FINDINGS: Segmentation: Transitional lumbosacral anatomy with largely
sacralized L5.

Alignment: Trace anterolisthesis of L3 on L4.

Vertebrae: No fracture or suspicious osseous lesion.

Paraspinal and other soft tissues: Partially visualized bilateral
renal cysts common small nonobstructing right renal calculus, and
hepatic steatosis as seen on the recent prior CT. Mild abdominal
aortic atherosclerosis without aneurysm.

Disc levels:

T10-11: Mild facet arthrosis without evidence of stenosis.

T11-12 through L2-3: Negative.

L3-4: Mild disc space narrowing. Circumferential disc bulging and
moderate to severe facet and ligamentum flavum hypertrophy result in
moderate to severe spinal stenosis and mild bilateral neural
foraminal stenosis.

L4-5: Moderate disc space narrowing with vacuum disc. Disc bulging,
a calcified central disc protrusion, and moderate to severe right
and mild-to-moderate left facet hypertrophy result in bilateral
lateral recess stenosis and mild bilateral neural foraminal stenosis
without significant generalized spinal stenosis.

L5-S1: Transitional anatomy with a hypoplastic disc.  No stenosis.
IMPRESSION: 1. No evidence of acute osseous abnormality or metastatic disease in
the lumbar spine.
2. Lumbar disc and facet degeneration most notable at L3-4 where
there is moderate to severe spinal stenosis and mild bilateral
neural foraminal stenosis.
3. Bilateral lateral recess stenosis and mild neural foraminal
stenosis at L4-5.
4. Aortic Atherosclerosis ([5J]-[5J]).

## 2020-08-07 MED ORDER — IOHEXOL 300 MG/ML  SOLN
100.0000 mL | Freq: Once | INTRAMUSCULAR | Status: AC | PRN
  Administered 2020-08-07: 100 mL via INTRAVENOUS

## 2020-08-07 NOTE — Progress Notes (Signed)
   Subjective:   Patient states he generally feels pretty well compared yesterday.  He does report new symptom of not being able to tell when he is stooling.  States that twice he sat down to urinate and was stooling without being aware of it.  Has not had incontinence otherwise.  States that the area feels numb like his arm.  Objective:  Vital signs in last 24 hours: Vitals:   08/06/20 1126 08/06/20 1633 08/06/20 2000 08/07/20 0338  BP: 124/79 118/84 111/75 119/80  Pulse: 78 80 76 (!) 58  Resp: 16 20 20 18   Temp: 98 F (36.7 C) 98.1 F (36.7 C) 98.1 F (36.7 C) 98 F (36.7 C)  TempSrc: Oral Oral Oral Oral  SpO2: 92% 92% 100% 94%  Weight:      Height:        Physical Exam Constitutional: no acute distress Head: atraumatic ENT: external ears normal Eyes: EOMI Pulmonary: effort normal Abdominal: flat Skin: warm and dry, numerous diffuse nevi, actinic keratosis, seborrheic keratosis, and hemangiomas Neurological: alert.  Speech with mild aphasic elements, much improved compared to admission. RUE and RLE motor strength is 4/5.  Gait is mildly ataxic due to right lower extremity weakness but ambulates fairly well independently Psychiatric: normal mood and affect  Assessment/Plan: Bryan Hobbs is a 70 y.o. male with hx of hypertension and melanoma presenting for acute onset aphasia, CT head concerning for 2 new masses with hemorrhage concerning for metastatic melanoma. Prostate MRI also highly suspicious for cancer but unlikely to be the primary with no other mets on pan scan.   Active Problems:   Brain lesion   Aphasia   Elevated PSA   Prostate mass  Brain mass x2 with hemorrhage, concern for metastatic melanoma Aphasia, right upper extremity weakness, right lower extremity weakness CT and MRI show two mass lesions involving his posterior left frontal lobe and parietal lobe with acute hemorrhage in one of the lesions. Hx of melanoma x3. CT abd, pelvis, and chest without  evidence of other tumor. No know immunocompromising conditions, HIV negative.  He has new incontinence and saddle anesthesia today.  - Lumbar CT with and without contrast today, postponing MRI as he will need general anesthesia to tolerate this - Neurology, neurosurgery, and oncology following - Plan for resection on Wednesday - Continue Decadron for cerebral edema - Keppra and Ativan per neurology - Neuro checks    Prostate cancer, high suspicion MRI prostate with PIRADS category V lesion. PSA 7.1 in February, 18.9 this admission. Spoke with urology who do not believe this is related as his pan scan did not show any mets, recommend outpatient biopsy. - follow up outpatient for prostate biopsy  CKD stage IIIa Creatinine 1.5 on admission, which seems close to his baseline. - Avoid nephrotoxins - Follow-up microalbumin creatinine ratio   Diet: Dysphagia 3 IVF:  none VTE:  SCD Prior to Admission Living Arrangement:  home Anticipated Discharge Location:  TBD Barriers to Discharge:  Resection of brain mass planned 4/27 Dispo: TBD  [name], MD 08/07/2020, 6:39 AM Pager: [phone] After 5pm on weekdays and 1pm on weekends: On Call pager [phone]

## 2020-08-08 DIAGNOSIS — M4802 Spinal stenosis, cervical region: Secondary | ICD-10-CM | POA: Diagnosis not present

## 2020-08-08 DIAGNOSIS — M48061 Spinal stenosis, lumbar region without neurogenic claudication: Secondary | ICD-10-CM

## 2020-08-08 DIAGNOSIS — G4733 Obstructive sleep apnea (adult) (pediatric): Secondary | ICD-10-CM

## 2020-08-08 DIAGNOSIS — R4701 Aphasia: Secondary | ICD-10-CM | POA: Diagnosis not present

## 2020-08-08 DIAGNOSIS — I619 Nontraumatic intracerebral hemorrhage, unspecified: Secondary | ICD-10-CM | POA: Diagnosis not present
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
## 2020-08-09 DIAGNOSIS — C7931 Secondary malignant neoplasm of brain: Secondary | ICD-10-CM

## 2020-08-09 DIAGNOSIS — N4289 Other specified disorders of prostate: Secondary | ICD-10-CM | POA: Diagnosis not present

## 2020-08-09 NOTE — Progress Notes (Signed)
Physical Therapy Treatment Patient Details Name: Bryan Hobbs MRN: [id number] DOB: 07/03/1950 Today's Date: 08/09/2020    History of Present Illness This 70 y.o. male admitted 4/18 with speech difficulty.  CT showed lesion in the Lt posterior frontal and parietal lobe with acute hemorrhage, and another lesion in the posterior Rt parietal lobe which are likely metastases from unknown origin.  MRI of abdomen showed PIRADS category V lesion which is highly suspicious for CA.  However, it is felt that it is unlikley that this is the primary site and appears that brain mets may be melanoma mets. PMH: h/o melanoma, Hepatitis A, HTN, sleep apnea    PT Comments    Pt making good progress towards his physical therapy goals. Continues with mild right sided deficits. Ambulating x 400 feet with no assistive device and negotiated 12 steps without physical difficulty. Will need to reassess following surgery on Wednesday.    Follow Up Recommendations  Outpatient PT;Supervision - Intermittent     Equipment Recommendations  None recommended by PT    Recommendations for Other Services       Precautions / Restrictions Precautions Precautions: Fall Restrictions Weight Bearing Restrictions: No    Mobility  Bed Mobility               General bed mobility comments: Standing upon arrival    Transfers Overall transfer level: Independent Equipment used: None                Ambulation/Gait Ambulation/Gait assistance: Modified independent (Device/Increase time) Gait Distance (Feet): 400 Feet Assistive device: None Gait Pattern/deviations: Step-through pattern;Decreased weight shift to right Gait velocity: reduced   General Gait Details: Decreased weight shift to right, steady pace, able to perform high level balance activities i.e. head turns, pivot turns without overt LOB   Stairs Stairs: Yes Stairs assistance: Supervision Stair Management: One rail Left Number of Stairs:  12 General stair comments: Step by step ascending, step over step descending   Wheelchair Mobility    Modified Rankin (Stroke Patients Only) Modified Rankin (Stroke Patients Only) Pre-Morbid Rankin Score: No symptoms Modified Rankin: Moderately severe disability     Balance Overall balance assessment: Needs assistance Sitting-balance support: Feet supported Sitting balance-Leahy Scale: Good     Standing balance support: No upper extremity supported;During functional activity Standing balance-Leahy Scale: Good                              Cognition Arousal/Alertness: Awake/alert Behavior During Therapy: WFL for tasks assessed/performed Overall Cognitive Status: Impaired/Different from baseline Area of Impairment: Memory;Problem solving                             Problem Solving: Difficulty sequencing;Requires verbal cues General Comments: Very pleasant and agreeable to therapy. Excited to be mobilizing in the hallways with a view out the window. Pt aware of right sided deficits. Reports continued difficulty with word finding and spelling with texting      Exercises      General Comments        Pertinent Vitals/Pain Pain Assessment: Faces Faces Pain Scale: No hurt    Home Living                      Prior Function            PT Goals (current goals can now be found in the care  plan section) Acute Rehab PT Goals Patient Stated Goal: To have surgery and get back home PT Goal Formulation: With patient/family Time For Goal Achievement: 08/19/20 Potential to Achieve Goals: Good Progress towards PT goals: Progressing toward goals    Frequency    Min 3X/week      PT Plan Current plan remains appropriate    Co-evaluation              AM-PAC PT "6 Clicks" Mobility   Outcome Measure  Help needed turning from your back to your side while in a flat bed without using bedrails?: None Help needed moving from lying on  your back to sitting on the side of a flat bed without using bedrails?: None Help needed moving to and from a bed to a chair (including a wheelchair)?: None Help needed standing up from a chair using your arms (e.g., wheelchair or bedside chair)?: None Help needed to walk in hospital room?: None Help needed climbing 3-5 steps with a railing? : A Little 6 Click Score: 23    End of Session Equipment Utilized During Treatment: Gait belt Activity Tolerance: Patient tolerated treatment well Patient left: in bed   PT Visit Diagnosis: Unsteadiness on feet (R26.81);Other abnormalities of gait and mobility (R26.89);Muscle weakness (generalized) (M62.81);Difficulty in walking, not elsewhere classified (R26.2);Other symptoms and signs involving the nervous system (R29.898);Apraxia (R48.2)     Time: 7591-6384 PT Time Calculation (min) (ACUTE ONLY): 15 min  Charges:  $Therapeutic Activity: 8-22 mins                     Wyona Almas, PT, DPT Acute Rehabilitation Services Pager [phone] Office [phone]    Deno Etienne 08/09/2020, 1:37 PM

## 2020-08-09 NOTE — Progress Notes (Signed)
  Speech Language Pathology Treatment: Cognitive-Linquistic  Patient Details Name: Bryan Hobbs MRN: [id number] DOB: [birth year] Today's Date: 08/09/2020 Time: 8882-8003 SLP Time Calculation (min) (ACUTE ONLY): 22 min  Assessment / Plan / Recommendation Clinical Impression  Pt seen for intervention of language abilities and progression of verbal expression. Expression is fluent in conversation with phonemic paraphasias present intermittently. Reviewed strategies he implements during dysnomic instances and states he "uses other words" and provided examples. Occasionaly he was not aware of errors although meaning of message was not affected. He generated and wrote several sentences without errors. Pt states the steroids have significantly and positively impacted his expression. Plans are for tumor resection Wed- plan to see Thursday. He states no difficulty since texture upgraded to regular and no need to observe him- goals met for swallow.   HPI HPI: Pt is a 70 yo male presenting with aphasia and slurred speech. CT Head showed a 3.4 x 2.6 x 2.8 cm lesion in the left posterior frontal and parietal lobe with acute hemorrhage, and another 1.4 cm lesion in the posterior right parietal lobe. Concern is for metastatic lesions, with additional w/u underway. PMH includes: HTN, melanoma x3 s/p surgery, recent dental work (~3 weeks PTA, after which mild changes in his speech may have been noted)      SLP Plan  Continue with current plan of care       Recommendations                   Oral Care Recommendations: Oral care BID Follow up Recommendations:  (TBA) SLP Visit Diagnosis: Aphasia (R47.01) Plan: Continue with current plan of care       GO                Houston Siren 08/09/2020, 4:26 PM   Orbie Pyo Colvin Caroli.Ed Actor Pager [phone] Office [phone]     Orbie Pyo Peerless.Ed Risk analyst  [phone] Office [phone]

## 2020-08-09 NOTE — Progress Notes (Signed)
Bryan Hobbs  [phone] [phone]     ID: Bryan Hobbs DOB: [birth year]  MR#: [id number]  [id number]  Patient Care Team: Pcp, No as PCP - General Mikey Bussing, NP OTHER MD:  INTERVAL HISTORY: Bryan Hobbs had 1 episode of perirectal numbness with a small amount of stool incontinence over the weekend.  Since this time, he has not had recurrent symptoms.  Overall he feels well.  REVIEW OF SYSTEMS: The patient is eating well.  He denies any headaches or dizziness.  The patient is scheduled to go to the OR for left craniotomy for resection of his tumor on 4/27. A detailed ROS was otherwise non-contributory  PAST MEDICAL HISTORY: Past Medical History:  Diagnosis Date  . Hepatitis A   . Hypertension   . Melanoma ([place])   . Pre-diabetes   . Sleep apnea   Hepatic steatosis  PAST SURGICAL HISTORY: Past Surgical History:  Procedure Laterality Date  . APPENDECTOMY  2017  . CYSTOSCOPY W/ URETERAL STENT PLACEMENT    . HERNIA REPAIR    . [facility], 2010, 2018  . RADIOLOGY WITH ANESTHESIA N/A 08/04/2020   Procedure: MRI WITH ANESTHESIA;  Surgeon: Radiologist, Medication, MD;  Location: [facility];  Service: Radiology;  Laterality: N/A;    FAMILY HISTORY Family History  Problem Relation Age of Onset  . Cancer Sister   The patient does not know the cause of death of his parents--father died age 69, mother age 69. The patient has one brother with a history of skin cancer, unclear type, and a sister who died from cancer, patient does not know what type. A paternal great grandmother had cancer. 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 He has four children from his first marriage, three surviving. When the patient came out as gay his family cut  contact with him. His only communication with them is through a son-in-law who is an MD (he has  let the children know the patient's current situation). Shiquan married [name] in 2008. [name] is a Animal nutritionist in the local [place]. Detavious is a Psychologist, forensic   ADVANCED DIRECTIVES: in the absence of any documents to the contrary, the patient's husband is his HCPOA  HEALTH MAINTENANCE: Social History   Tobacco Use  . Smoking status: Never Smoker  Substance Use Topics  . Alcohol use: Yes    Alcohol/week: 4.0 standard drinks    Types: 4 Glasses of wine per week  . Drug use: Never     Colonoscopy: UTD  PSA: 18.93 on 08/05/2020  Bone density:   Allergies  Allergen Reactions  . Peanut-Containing Drug Products Swelling  . Shellfish Allergy Swelling  . Penicillins     He was told by a parent that he had an allergy as a child    Current Facility-Administered Medications  Medication Dose Route Frequency Provider Last Rate Last Admin  . acetaminophen (TYLENOL) tablet 650 mg  650 mg Oral Q6H PRN Jose Persia, MD       Or  . acetaminophen (TYLENOL) suppository 650 mg  650 mg Rectal Q6H PRN Jose Persia, MD      . dexamethasone (DECADRON) injection 4 mg  4 mg Intravenous Q8H Newman Pies, MD   4 mg at 08/09/20 1409  . multivitamin with minerals tablet 1 tablet  1 tablet  Oral Daily Verdene Lennert, MD   1 tablet at 08/09/20 [phone]  . ondansetron (ZOFRAN) tablet 4 mg  4 mg Oral Q6H PRN Verdene Lennert, MD       Or  . ondansetron (ZOFRAN) injection 4 mg  4 mg Intravenous Q6H PRN Verdene Lennert, MD      . polyethylene glycol (MIRALAX / GLYCOLAX) packet 17 g  17 g Oral Daily PRN Verdene Lennert, MD      . sodium chloride flush (NS) 0.9 % injection 3 mL  3 mL Intravenous Q12H Verdene Lennert, MD   3 mL at 08/09/20 0949    OBJECTIVE: white man examined in bed  Vitals:   08/09/20 0800 08/09/20 1410  BP: 122/86   Pulse: (!) 50   Resp: 20 18  Temp: 97.6 F (36.4 C) 97.8  F (36.6 C)  SpO2: 94%      Body mass index is 34.25 kg/m.   Wt Readings from Last 3 Encounters:  08/02/20 99.2 kg      ECOG FS:2 - Symptomatic, <50% confined to bed  Ocular: Sclerae unicteric Lymphatic: No cervical or supraclavicular adenopathy, no axillary adenopathy Lungs no rales or rhonchi Heart regular rate and rhythm Abd soft, nontender, positive bowel sounds MSK no focal spinal tenderness Neuro: non-focal, well-oriented, appropriate affect; speech as described above, no motor deficits, sensation intact Skin: scars from prior melanoma resections noted; no suspicious skin lesions   LAB RESULTS:  CMP     Component Value Date/Time   NA 138 08/07/2020 0333   K 4.3 08/07/2020 0333   CL 107 08/07/2020 0333   CO2 21 (L) 08/07/2020 0333   GLUCOSE 154 (H) 08/07/2020 0333   BUN 32 (H) 08/07/2020 0333   CREATININE 1.54 (H) 08/07/2020 0333   CALCIUM 9.5 08/07/2020 0333   PROT 6.8 08/03/2020 0547   ALBUMIN 3.7 08/03/2020 0547   AST 19 08/03/2020 0547   ALT 21 08/03/2020 0547   ALKPHOS 37 (L) 08/03/2020 0547   BILITOT 0.7 08/03/2020 0547   GFRNONAA 49 (L) 08/07/2020 0333    No results found for: TOTALPROTELP, ALBUMINELP, A1GS, A2GS, BETS, BETA2SER, GAMS, MSPIKE, SPEI  No results found for: KPAFRELGTCHN, LAMBDASER, KAPLAMBRATIO  Lab Results  Component Value Date   WBC 10.5 08/07/2020   NEUTROABS 7.1 08/03/2020   HGB 16.1 08/07/2020   HCT 48.3 08/07/2020   MCV 90.6 08/07/2020   PLT 231 08/07/2020    @LASTCHEMISTRY @  No results found for: LABCA2  No components found for: LKGMWN027  No results for input(s): INR in the last 168 hours.  No results found for: LABCA2  No results found for: OZD664  No results found for: QIH474  No results found for: QVZ563  No results found for: CA2729  No components found for: HGQUANT  No results found for: CEA1 / No results found for: CEA1   No results found for: AFPTUMOR  No results found for: CHROMOGRNA  No  results found for: PSA1  Admission on 08/02/2020  Component Date Value Ref Range Status  . Prothrombin Time 08/02/2020 12.9  11.4 - 15.2 seconds Final  . INR 08/02/2020 1.0  0.8 - 1.2 Final   Comment: (NOTE) INR goal varies based on device and disease states. Performed at [facility], [address] [place], [place] [address]   . aPTT 08/02/2020 25  24 - 36 seconds Final   Performed at [facility], [address] [place], [place] [address]  . WBC 08/02/2020 7.5  4.0 - 10.5 K/uL Final  . RBC 08/02/2020 5.24  4.22 - 5.81 MIL/uL Final  . Hemoglobin 08/02/2020 16.0  13.0 - 17.0 g/dL Final  . HCT 76/22/6333 48.7  39.0 - 52.0 % Final  . MCV 08/02/2020 92.9  80.0 - 100.0 fL Final  . MCH 08/02/2020 30.5  26.0 - 34.0 pg Final  . MCHC 08/02/2020 32.9  30.0 - 36.0 g/dL Final  . RDW 54/56/2563 12.8  11.5 - 15.5 % Final  . Platelets 08/02/2020 202  150 - 400 K/uL Final  . nRBC 08/02/2020 0.0  0.0 - 0.2 % Final   Performed at [facility], [address] [place], [place] [address]  . Neutrophils Relative % 08/02/2020 66  % Final  . Neutro Abs 08/02/2020 5.0  1.7 - 7.7 K/uL Final  . Lymphocytes Relative 08/02/2020 22  % Final  . Lymphs Abs 08/02/2020 1.7  0.7 - 4.0 K/uL Final  . Monocytes Relative 08/02/2020 8  % Final  . Monocytes Absolute 08/02/2020 0.6  0.1 - 1.0 K/uL Final  . Eosinophils Relative 08/02/2020 3  % Final  . Eosinophils Absolute 08/02/2020 0.2  0.0 - 0.5 K/uL Final  . Basophils Relative 08/02/2020 0  % Final  . Basophils Absolute 08/02/2020 0.0  0.0 - 0.1 K/uL Final  . Immature Granulocytes 08/02/2020 1  % Final  . Abs Immature Granulocytes 08/02/2020 0.06  0.00 - 0.07 K/uL Final   Performed at [facility], [address] [place], [place] [address]  . Sodium 08/02/2020 139  135 - 145 mmol/L Final  . Potassium 08/02/2020 3.9  3.5 - 5.1 mmol/L Final  . Chloride 08/02/2020 106  98 - 111 mmol/L Final  . CO2 08/02/2020 24  22 - 32 mmol/L Final  .  Glucose, Bld 08/02/2020 129* 70 - 99 mg/dL Final   Glucose reference range applies only to samples taken after fasting for at least 8 hours.  . BUN 08/02/2020 22  8 - 23 mg/dL Final  . Creatinine, Ser 08/02/2020 1.68* 0.61 - 1.24 mg/dL Final  . Calcium 81/15/7262 9.4  8.9 - 10.3 mg/dL Final  . Total Protein 08/02/2020 7.0  6.5 - 8.1 g/dL Final  . Albumin 03/55/9741 4.0  3.5 - 5.0 g/dL Final  . AST 63/84/5364 20  15 - 41 U/L Final  . ALT 08/02/2020 20  0 - 44 U/L Final  . Alkaline Phosphatase 08/02/2020 34* 38 - 126 U/L Final  . Total Bilirubin 08/02/2020 0.9  0.3 - 1.2 mg/dL Final  . GFR, Estimated 08/02/2020 44* >60 mL/min Final   Comment: (NOTE) Calculated using the CKD-EPI Creatinine Equation (2021)   . Anion gap 08/02/2020 9  5 - 15 Final   Performed at [facility], [address] [place], [place] [address]  . Sodium 08/02/2020 142  135 - 145 mmol/L Final  . Potassium 08/02/2020 3.9  3.5 - 5.1 mmol/L Final  . Chloride 08/02/2020 107  98 - 111 mmol/L Final  . BUN 08/02/2020 26* 8 - 23 mg/dL Final  . Creatinine, Ser 08/02/2020 1.50* 0.61 - 1.24 mg/dL Final  . Glucose, Bld 04/09/8249 129* 70 - 99 mg/dL Final   Glucose reference range applies only to samples taken after fasting for at least 8 hours.  . Calcium, Ion 08/02/2020 1.12* 1.15 - 1.40 mmol/L Final  . TCO2 08/02/2020 24  22 - 32 mmol/L Final  . Hemoglobin 08/02/2020 16.7  13.0 - 17.0 g/dL Final  .  HCT 08/02/2020 49.0  39.0 - 52.0 % Final  . Glucose-Capillary 08/02/2020 134* 70 - 99 mg/dL Final   Glucose reference range applies only to samples taken after fasting for at least 8 hours.  Marland Kitchen SARS Coronavirus 2 by RT PCR 08/02/2020 NEGATIVE  NEGATIVE Final   Comment: (NOTE) SARS-CoV-2 target nucleic acids are NOT DETECTED.  The SARS-CoV-2 RNA is generally detectable in upper respiratory specimens during the acute phase of infection. The lowest concentration of SARS-CoV-2 viral copies this assay can detect is 138  copies/mL. A negative result does not preclude SARS-Cov-2 infection and should not be used as the sole basis for treatment or other patient management decisions. A negative result may occur with  improper specimen collection/handling, submission of specimen other than nasopharyngeal swab, presence of viral mutation(s) within the areas targeted by this assay, and inadequate number of viral copies(<138 copies/mL). A negative result must be combined with clinical observations, patient history, and epidemiological information. The expected result is Negative.  Fact Sheet for Patients:  EntrepreneurPulse.com.au  Fact Sheet for Healthcare Providers:  IncredibleEmployment.be  This test is no                          t yet approved or cleared by the [place] FDA and  has been authorized for detection and/or diagnosis of SARS-CoV-2 by FDA under an Emergency Use Authorization (EUA). This EUA will remain  in effect (meaning this test can be used) for the duration of the COVID-19 declaration under Section 564(b)(1) of the Act, 21 U.S.C.section 360bbb-3(b)(1), unless the authorization is terminated  or revoked sooner.      . Influenza A by PCR 08/02/2020 NEGATIVE  NEGATIVE Final  . Influenza B by PCR 08/02/2020 NEGATIVE  NEGATIVE Final   Comment: (NOTE) The Xpert Xpress SARS-CoV-2/FLU/RSV plus assay is intended as an aid in the diagnosis of influenza from Nasopharyngeal swab specimens and should not be used as a sole basis for treatment. Nasal washings and aspirates are unacceptable for Xpert Xpress SARS-CoV-2/FLU/RSV testing.  Fact Sheet for Patients: EntrepreneurPulse.com.au  Fact Sheet for Healthcare Providers: IncredibleEmployment.be  This test is not yet approved or cleared by the [place] FDA and has been authorized for detection and/or diagnosis of SARS-CoV-2 by FDA under an Emergency Use  Authorization (EUA). This EUA will remain in effect (meaning this test can be used) for the duration of the COVID-19 declaration under Section 564(b)(1) of the Act, 21 U.S.C. section 360bbb-3(b)(1), unless the authorization is terminated or revoked.  Performed at [facility], [place] [address], [place], [place] [address]   . WBC 08/03/2020 8.0  4.0 - 10.5 K/uL Final  . RBC 08/03/2020 5.50  4.22 - 5.81 MIL/uL Final  . Hemoglobin 08/03/2020 16.4  13.0 - 17.0 g/dL Final  . HCT 08/03/2020 50.9  39.0 - 52.0 % Final  . MCV 08/03/2020 92.5  80.0 - 100.0 fL Final  . MCH 08/03/2020 29.8  26.0 - 34.0 pg Final  . MCHC 08/03/2020 32.2  30.0 - 36.0 g/dL Final  . RDW 08/03/2020 12.5  11.5 - 15.5 % Final  . Platelets 08/03/2020 228  150 - 400 K/uL Final  . nRBC 08/03/2020 0.0  0.0 - 0.2 % Final  . Neutrophils Relative % 08/03/2020 89  % Final  . Neutro Abs 08/03/2020 7.1  1.7 - 7.7 K/uL Final  . Lymphocytes Relative 08/03/2020 9  % Final  . Lymphs Abs 08/03/2020 0.8  0.7 -  4.0 K/uL Final  . Monocytes Relative 08/03/2020 1  % Final  . Monocytes Absolute 08/03/2020 0.1  0.1 - 1.0 K/uL Final  . Eosinophils Relative 08/03/2020 0  % Final  . Eosinophils Absolute 08/03/2020 0.0  0.0 - 0.5 K/uL Final  . Basophils Relative 08/03/2020 0  % Final  . Basophils Absolute 08/03/2020 0.0  0.0 - 0.1 K/uL Final  . Immature Granulocytes 08/03/2020 1  % Final  . Abs Immature Granulocytes 08/03/2020 0.06  0.00 - 0.07 K/uL Final   Performed at [facility], [place] [address] [place], [place] [address]  . Sodium 08/03/2020 140  135 - 145 mmol/L Final  . Potassium 08/03/2020 4.5  3.5 - 5.1 mmol/L Final  . Chloride 08/03/2020 108  98 - 111 mmol/L Final  . CO2 08/03/2020 23  22 - 32 mmol/L Final  . Glucose, Bld 08/03/2020 151* 70 - 99 mg/dL Final   Glucose reference range applies only to samples taken after fasting for at least 8 hours.  . BUN 08/03/2020 18  8 - 23 mg/dL Final  . Creatinine, Ser 08/03/2020  1.47* 0.61 - 1.24 mg/dL Final  . Calcium 08/03/2020 9.4  8.9 - 10.3 mg/dL Final  . Total Protein 08/03/2020 6.8  6.5 - 8.1 g/dL Final  . Albumin 08/03/2020 3.7  3.5 - 5.0 g/dL Final  . AST 08/03/2020 19  15 - 41 U/L Final  . ALT 08/03/2020 21  0 - 44 U/L Final  . Alkaline Phosphatase 08/03/2020 37* 38 - 126 U/L Final  . Total Bilirubin 08/03/2020 0.7  0.3 - 1.2 mg/dL Final  . GFR, Estimated 08/03/2020 51* >60 mL/min Final   Comment: (NOTE) Calculated using the CKD-EPI Creatinine Equation (2021)   . Anion gap 08/03/2020 9  5 - 15 Final   Performed at [facility] [address], [place], [place] [address]  . MRSA by PCR 08/03/2020 NEGATIVE  NEGATIVE Final   Comment:        The GeneXpert MRSA Assay (FDA approved for NASAL specimens only), is one component of a comprehensive MRSA colonization surveillance program. It is not intended to diagnose MRSA infection nor to guide or monitor treatment for MRSA infections. Performed at [facility], [place] [address], [place], [place] [address]   . HIV Screen 4th Generation wRfx 08/03/2020 Non Reactive  Non Reactive Final   Performed at [facility], [place] [address], [place], [place] [address]  . Glucose-Capillary 08/04/2020 151* 70 - 99 mg/dL Final   Glucose reference range applies only to samples taken after fasting for at least 8 hours.  . Comment 1 08/04/2020 Notify RN   Final  . Microalb, Ur 08/04/2020 <3.0* Not Estab. ug/mL Final  . Microalb Creat Ratio 08/04/2020 <3  0 - 29 mg/g creat Final   Comment: (NOTE)                       Normal:                0 -  29                       Moderately increased: 30 - 300                       Severely increased:       >300 Performed At: [facility] [employer] [address] [place], [place] [id number]  Rush Farmer MD [id number]   . Creatinine, Urine 08/04/2020 107.9  Not Estab. mg/dL Final  . Hgb A1c MFr Bld 08/05/2020 5.8* 4.8 - 5.6 % Final   Comment: (NOTE) Pre  diabetes:          5.7%-6.4%  Diabetes:              >6.4%  Glycemic control for   <7.0% adults with diabetes   . Mean Plasma Glucose 08/05/2020 119.76  mg/dL Final   Performed at [facility] [address], [place], [place] [address]  . WBC 08/05/2020 11.7* 4.0 - 10.5 K/uL Final  . RBC 08/05/2020 5.18  4.22 - 5.81 MIL/uL Final  . Hemoglobin 08/05/2020 15.6  13.0 - 17.0 g/dL Final  . HCT 08/05/2020 47.9  39.0 - 52.0 % Final  . MCV 08/05/2020 92.5  80.0 - 100.0 fL Final  . MCH 08/05/2020 30.1  26.0 - 34.0 pg Final  . MCHC 08/05/2020 32.6  30.0 - 36.0 g/dL Final  . RDW 08/05/2020 12.8  11.5 - 15.5 % Final  . Platelets 08/05/2020 214  150 - 400 K/uL Final  . nRBC 08/05/2020 0.0  0.0 - 0.2 % Final   Performed at [facility], [place] [address] [place], [place] [address]  . Sodium 08/05/2020 140  135 - 145 mmol/L Final  . Potassium 08/05/2020 4.2  3.5 - 5.1 mmol/L Final   SPECIMEN HEMOLYZED. HEMOLYSIS MAY AFFECT INTEGRITY OF RESULTS.  Marland Kitchen Chloride 08/05/2020 107  98 - 111 mmol/L Final  . CO2 08/05/2020 24  22 - 32 mmol/L Final  . Glucose, Bld 08/05/2020 108* 70 - 99 mg/dL Final   Glucose reference range applies only to samples taken after fasting for at least 8 hours.  . BUN 08/05/2020 32* 8 - 23 mg/dL Final  . Creatinine, Ser 08/05/2020 1.76* 0.61 - 1.24 mg/dL Final  . Calcium 08/05/2020 9.1  8.9 - 10.3 mg/dL Final  . GFR, Estimated 08/05/2020 41* >60 mL/min Final   Comment: (NOTE) Calculated using the CKD-EPI Creatinine Equation (2021)   . Anion gap 08/05/2020 9  5 - 15 Final   Performed at [facility] [address] [place], [place] [address]  . Prostatic Specific Antigen 08/05/2020 18.93* 0.00 - 4.00 ng/mL Final   Comment: (NOTE) While PSA levels of <=4.0 ng/ml are reported as reference range, some men with levels below 4.0 ng/ml can have prostate cancer and many men with PSA above 4.0 ng/ml do not have prostate cancer.  Other tests such as free PSA, age  specific reference ranges, PSA velocity and PSA doubling time may be helpful especially in men less than 74 years old. Performed at [facility], [place] [address] [place],  [address]   . Sodium 08/06/2020 143  135 - 145 mmol/L Final  . Potassium 08/06/2020 4.2  3.5 - 5.1 mmol/L Final  . Chloride 08/06/2020 110  98 - 111 mmol/L Final  . CO2 08/06/2020 25  22 - 32 mmol/L Final  . Glucose, Bld 08/06/2020 97  70 - 99 mg/dL Final   Glucose reference range applies only to samples taken after fasting for at least 8 hours.  . BUN 08/06/2020 31* 8 - 23 mg/dL Final  . Creatinine, Ser 08/06/2020 1.58* 0.61 - 1.24 mg/dL Final  . Calcium 08/06/2020 9.0  8.9 - 10.3 mg/dL Final  . GFR, Estimated 08/06/2020 47* >60 mL/min Final   Comment: (NOTE) Calculated using the CKD-EPI Creatinine  Equation (2021)   . Anion gap 08/06/2020 8  5 - 15 Final   Performed at [facility] [address] [place], [place] [address]  . WBC 08/06/2020 8.8  4.0 - 10.5 K/uL Final  . RBC 08/06/2020 5.16  4.22 - 5.81 MIL/uL Final  . Hemoglobin 08/06/2020 15.6  13.0 - 17.0 g/dL Final  . HCT 08/06/2020 47.7  39.0 - 52.0 % Final  . MCV 08/06/2020 92.4  80.0 - 100.0 fL Final  . MCH 08/06/2020 30.2  26.0 - 34.0 pg Final  . MCHC 08/06/2020 32.7  30.0 - 36.0 g/dL Final  . RDW 08/06/2020 12.7  11.5 - 15.5 % Final  . Platelets 08/06/2020 203  150 - 400 K/uL Final  . nRBC 08/06/2020 0.0  0.0 - 0.2 % Final   Performed at [facility], [place] [address] [place], [place] [address]  . Sodium 08/07/2020 138  135 - 145 mmol/L Final  . Potassium 08/07/2020 4.3  3.5 - 5.1 mmol/L Final  . Chloride 08/07/2020 107  98 - 111 mmol/L Final  . CO2 08/07/2020 21* 22 - 32 mmol/L Final  . Glucose, Bld 08/07/2020 154* 70 - 99 mg/dL Final   Glucose reference range applies only to samples taken after fasting for at least 8 hours.  . BUN 08/07/2020 32* 8 - 23 mg/dL Final  . Creatinine, Ser 08/07/2020 1.54* 0.61 - 1.24 mg/dL Final   . Calcium 08/07/2020 9.5  8.9 - 10.3 mg/dL Final  . GFR, Estimated 08/07/2020 49* >60 mL/min Final   Comment: (NOTE) Calculated using the CKD-EPI Creatinine Equation (2021)   . Anion gap 08/07/2020 10  5 - 15 Final   Performed at [facility] [address], [place], [place] [address]  . WBC 08/07/2020 10.5  4.0 - 10.5 K/uL Final  . RBC 08/07/2020 5.33  4.22 - 5.81 MIL/uL Final  . Hemoglobin 08/07/2020 16.1  13.0 - 17.0 g/dL Final  . HCT 08/07/2020 48.3  39.0 - 52.0 % Final  . MCV 08/07/2020 90.6  80.0 - 100.0 fL Final  . MCH 08/07/2020 30.2  26.0 - 34.0 pg Final  . MCHC 08/07/2020 33.3  30.0 - 36.0 g/dL Final  . RDW 08/07/2020 12.5  11.5 - 15.5 % Final  . Platelets 08/07/2020 231  150 - 400 K/uL Final  . nRBC 08/07/2020 0.0  0.0 - 0.2 % Final   Performed at [facility], [place] [address] [place], [place] [address]    (this displays the last labs from the last 3 days)  No results found for: TOTALPROTELP, ALBUMINELP, A1GS, A2GS, BETS, BETA2SER, GAMS, MSPIKE, SPEI (this displays SPEP labs)  No results found for: KPAFRELGTCHN, LAMBDASER, KAPLAMBRATIO (kappa/lambda light chains)  No results found for: HGBA, HGBA2QUANT, HGBFQUANT, HGBSQUAN (Hemoglobinopathy evaluation)   No results found for: LDH  No results found for: IRON, TIBC, IRONPCTSAT (Iron and TIBC)  No results found for: FERRITIN  Urinalysis No results found for: COLORURINE, APPEARANCEUR, LABSPEC, PHURINE, GLUCOSEU, HGBUR, BILIRUBINUR, KETONESUR, PROTEINUR, UROBILINOGEN, NITRITE, LEUKOCYTESUR   STUDIES: CT Chest W Contrast  Result Date: 08/02/2020 CLINICAL DATA:  CNS neoplasm, staging evaluation EXAM: CT CHEST, ABDOMEN, AND PELVIS WITH CONTRAST TECHNIQUE: Multidetector CT imaging of the chest, abdomen and pelvis was performed following the standard protocol during bolus administration of intravenous contrast. CONTRAST:  65mL OMNIPAQUE IOHEXOL 300 MG/ML  SOLN COMPARISON:  None. FINDINGS: CT CHEST FINDINGS  Cardiovascular: The heart is normal in size. No pericardial effusion.  No evidence of thoracic aortic aneurysm. Coronary atherosclerosis of the LAD. Mediastinum/Nodes: No suspicious mediastinal lymphadenopathy. Visualized thyroid is unremarkable. Lungs/Pleura: Mild dependent atelectasis in the bilateral upper and lower lobes. No focal consolidation. No suspicious pulmonary nodules. No pleural effusion or pneumothorax. Musculoskeletal: Degenerative changes of the thoracic spine. CT ABDOMEN PELVIS FINDINGS Hepatobiliary: Liver is notable for hepatic steatosis with focal fatty sparing along the gallbladder fossa. Gallbladder is unremarkable. No intrahepatic or extrahepatic ductal dilatation. Pancreas: Within normal limits. Spleen: Within normal limits, noting a splenule in the splenic hilum (series 3/image 56). Adrenals/Urinary Tract: Adrenal glands are within normal limits. Bilateral renal cysts, measuring up to 5.5 cm in the anterior left upper kidney (series 3/image 62). 2 mm nonobstructing left lower pole renal calculus (series 3/image 69). No ureteral or bladder calculi. No hydronephrosis. Bladder is within normal limits. Stomach/Bowel: Stomach is within normal limits. No evidence of bowel obstruction. Prior appendectomy. Mild left colonic diverticulosis, without evidence of diverticulitis. Vascular/Lymphatic: No evidence of abdominal aortic aneurysm. Mild atherosclerotic calcifications of the abdominal aorta. No suspicious abdominopelvic lymphadenopathy. Reproductive: Prostate is grossly unremarkable. Other: No abdominopelvic ascites. Tiny fat containing bilateral inguinal hernias (series 3/image 113). Musculoskeletal: Degenerative changes of the lumbar spine. IMPRESSION: No CT findings suspicious for primary malignancy in the chest, abdomen, or pelvis. Electronically Signed   By: Julian Hy M.D.   On: 08/02/2020 17:04   CT LUMBAR SPINE W CONTRAST  Result Date: 08/07/2020 CLINICAL DATA:  Cauda equina  syndrome. Brain mass is concerning for metastatic melanoma. EXAM: CT LUMBAR SPINE WITH CONTRAST TECHNIQUE: Multidetector CT imaging of the lumbar spine was performed with intravenous contrast administration. CONTRAST:  164mL OMNIPAQUE IOHEXOL 300 MG/ML  SOLN COMPARISON:  CT chest, abdomen, and pelvis 08/02/2020 FINDINGS: Segmentation: Transitional lumbosacral anatomy with largely sacralized L5. Alignment: Trace anterolisthesis of L3 on L4. Vertebrae: No fracture or suspicious osseous lesion. Paraspinal and other soft tissues: Partially visualized bilateral renal cysts common small nonobstructing right renal calculus, and hepatic steatosis as seen on the recent prior CT. Mild abdominal aortic atherosclerosis without aneurysm. Disc levels: T10-11: Mild facet arthrosis without evidence of stenosis. T11-12 through L2-3: Negative. L3-4: Mild disc space narrowing. Circumferential disc bulging and moderate to severe facet and ligamentum flavum hypertrophy result in moderate to severe spinal stenosis and mild bilateral neural foraminal stenosis. L4-5: Moderate disc space narrowing with vacuum disc. Disc bulging, a calcified central disc protrusion, and moderate to severe right and mild-to-moderate left facet hypertrophy result in bilateral lateral recess stenosis and mild bilateral neural foraminal stenosis without significant generalized spinal stenosis. L5-S1: Transitional anatomy with a hypoplastic disc.  No stenosis. IMPRESSION: 1. No evidence of acute osseous abnormality or metastatic disease in the lumbar spine. 2. Lumbar disc and facet degeneration most notable at L3-4 where there is moderate to severe spinal stenosis and mild bilateral neural foraminal stenosis. 3. Bilateral lateral recess stenosis and mild neural foraminal stenosis at L4-5. 4. Aortic Atherosclerosis (ICD10-I70.0). Electronically Signed   By: Logan Bores M.D.   On: 08/07/2020 18:02   MR BRAIN W WO CONTRAST  Result Date: 08/04/2020 CLINICAL  DATA:  Stroke follow-up. EXAM: MRI HEAD WITHOUT AND WITH CONTRAST TECHNIQUE: Multiplanar, multiecho pulse sequences of the brain and surrounding structures were obtained without and with intravenous contrast. CONTRAST:  55mL GADAVIST GADOBUTROL 1 MMOL/ML IV SOLN COMPARISON:  None. FINDINGS: Brain: Approximately 3.4 by 3.1 by 3.4 cm hemorrhagic mass in the posterior left frontal lobe with areas of internal T2 hypointensity, there a genius T1 hyperintensity  and susceptibility artifact. There is irregular and wispy peripheral enhancement and restricted diffusion, compatible with hypercellularity. The mass abuts the overlying left frontal dura. dditional 1.5 by 0.6 by 0.9 cm enhancing mass within the right parietal lobe, which also demonstrates some faint peripheral restricted diffusion, compatible with hypercellularity. Both lesions demonstrate exuberant surrounding vasogenic edema. Resulting mass effect on the left lateral ventricle which is partially effaced. Additionally, there is approximately 3 mm of rightward midline shift at the foramen of Monro. Small focus of nonenhancing left frontal periventricular restricted diffusion (series 450, image 33). Additional scattered mild T2/FLAIR hyperintensities within the white matter, nonspecific but likely related to chronic microvascular ischemic disease. No hydrocephalus. Vascular: Major arterial flow voids are maintained at the skull base. Skull and upper cervical spine: Normal marrow signal. Sinuses/Orbits: Mucosal thickening of ethmoid air cells, frontal sinuses and sphenoid sinuses. Unremarkable orbits. Other: Heterogeneous T1 hyperintensity involving the left nasopharynx. No mastoid effusions. IMPRESSION: 1. Large hemorrhagic and peripherally enhancing heterogeneous mass in the left posterior frontal lobe and additional enhancing mass in the right parietal lobe, as detailed above. Given multifocality, findings are concerning for metastatic disease. Multicentric  high-grade glioma is a less likely differential consideration. 2. Exuberant surrounding edema. Resulting mass effect with partial left lateral ventricular effacement and 3 mm of rightward midline shift. 3. Small focus of nonenhancing left frontal periventricular restricted diffusion, which could represent acute infarct or an additional nonenhancing lesion. Recommend attention on close follow-up. 4. Heterogeneous T1 hyperintensity involving the left nasopharynx, which is indeterminate but potentially represents proteinaceous retention cysts. This area is amenable to direct inspection. Electronically Signed   By: Margaretha Sheffield MD   On: 08/04/2020 13:18   CT Abdomen Pelvis W Contrast  Result Date: 08/02/2020 CLINICAL DATA:  CNS neoplasm, staging evaluation EXAM: CT CHEST, ABDOMEN, AND PELVIS WITH CONTRAST TECHNIQUE: Multidetector CT imaging of the chest, abdomen and pelvis was performed following the standard protocol during bolus administration of intravenous contrast. CONTRAST:  24mL OMNIPAQUE IOHEXOL 300 MG/ML  SOLN COMPARISON:  None. FINDINGS: CT CHEST FINDINGS Cardiovascular: The heart is normal in size. No pericardial effusion. No evidence of thoracic aortic aneurysm. Coronary atherosclerosis of the LAD. Mediastinum/Nodes: No suspicious mediastinal lymphadenopathy. Visualized thyroid is unremarkable. Lungs/Pleura: Mild dependent atelectasis in the bilateral upper and lower lobes. No focal consolidation. No suspicious pulmonary nodules. No pleural effusion or pneumothorax. Musculoskeletal: Degenerative changes of the thoracic spine. CT ABDOMEN PELVIS FINDINGS Hepatobiliary: Liver is notable for hepatic steatosis with focal fatty sparing along the gallbladder fossa. Gallbladder is unremarkable. No intrahepatic or extrahepatic ductal dilatation. Pancreas: Within normal limits. Spleen: Within normal limits, noting a splenule in the splenic hilum (series 3/image 56). Adrenals/Urinary Tract: Adrenal glands are  within normal limits. Bilateral renal cysts, measuring up to 5.5 cm in the anterior left upper kidney (series 3/image 62). 2 mm nonobstructing left lower pole renal calculus (series 3/image 69). No ureteral or bladder calculi. No hydronephrosis. Bladder is within normal limits. Stomach/Bowel: Stomach is within normal limits. No evidence of bowel obstruction. Prior appendectomy. Mild left colonic diverticulosis, without evidence of diverticulitis. Vascular/Lymphatic: No evidence of abdominal aortic aneurysm. Mild atherosclerotic calcifications of the abdominal aorta. No suspicious abdominopelvic lymphadenopathy. Reproductive: Prostate is grossly unremarkable. Other: No abdominopelvic ascites. Tiny fat containing bilateral inguinal hernias (series 3/image 113). Musculoskeletal: Degenerative changes of the lumbar spine. IMPRESSION: No CT findings suspicious for primary malignancy in the chest, abdomen, or pelvis. Electronically Signed   By: Julian Hy M.D.   On: 08/02/2020 17:04  MR PROSTATE W WO CONTRAST  Result Date: 08/04/2020 CLINICAL DATA:  Enlarged prostate with elevated PSA by report. EXAM: MR PROSTATE WITHOUT AND WITH CONTRAST TECHNIQUE: Multiplanar multisequence MRI images were obtained of the pelvis centered about the prostate. Pre and post contrast images were obtained. CONTRAST:  21mL GADAVIST GADOBUTROL 1 MMOL/ML IV SOLN COMPARISON:  None. FINDINGS: Prostate: Transitional zone: (Image 15, series 18) homogeneous low T2 signal with lenticular appearance along the surgical capsule of the RIGHT posterolateral transitional zone near the base to mid of the RIGHT prostate gland showing very dark appearance on the ADC map and bright appearance on diffusion-weighted imaging PIRADS category 5 Signs of BPH elsewhere but with other areas of restricted diffusion most notably in the LEFT anterior mid gland. This does not correspond to a homogeneous area of low T2 signal and is therefore classified as PIRADS  category 3 is that also is contained within a BPH nodule (image 13 of the ADC map). LEFT mid gland anterior transitional zone Peripheral zone: Linear and wedge-shaped areas of T2 hypointensity throughout the peripheral zone compatible with prior prostatitis. No signs of post biopsy hemorrhage. Volume: 51.3 cc Transcapsular spread:  Absent Seminal vesicle involvement: Absent Neurovascular bundle involvement: Absent Pelvic adenopathy: Absent Bone metastasis: Absent Other findings: None IMPRESSION: 1. PIRADS category 5 lesion in the RIGHT posterolateral transitional zone near the base to mid of the RIGHT prostate gland. Highly suspicious for prostate neoplasm. 2. PIRADS category 3 lesion in the LEFT anterior mid gland transitional zone. 3. Findings of prior prostatitis. 4. Signs of BPH. Electronically Signed   By: Zetta Bills M.D.   On: 08/04/2020 15:33   EEG adult  Result Date: 08/03/2020 Lora Havens, MD     08/03/2020  1:17 PM Patient Name: Bryan Hobbs MRN: [id number] Epilepsy Attending: Lora Havens Referring Physician/Provider: Dr Lesleigh Noe Date: 08/03/2020 Duration: 23 mins Patient history: 70 year old male with acute onset aphasia.  EEG to evaluate for seizures. Level of alertness: Awake, asleep AEDs during EEG study: Keppra Technical aspects: This EEG study was done with scalp electrodes positioned according to the 10-20 International system of electrode placement. Electrical activity was acquired at a sampling rate of 500Hz  and reviewed with a high frequency filter of 70Hz  and a low frequency filter of 1Hz . EEG data were recorded continuously and digitally stored. Description: The posterior dominant rhythm consists of 9-10 Hz activity of moderate voltage (25-35 uV) seen predominantly in posterior head regions, symmetric and reactive to eye opening and eye closing. Sleep was characterized by vertex waves, sleep spindles (12 to 14 Hz), maximal frontocentral region. Hyperventilation and  photic stimulation were not performed.   IMPRESSION: This study is within normal limits. No seizures or epileptiform discharges were seen throughout the recording. Lora Havens   CT HEAD CODE STROKE WO CONTRAST  Result Date: 08/02/2020 CLINICAL DATA:  Code stroke. Neuro deficit. Acute stroke suspected. New onset of aphasia and left-sided facial droop beginning 2 hours ago. EXAM: CT HEAD WITHOUT CONTRAST TECHNIQUE: Contiguous axial images were obtained from the base of the skull through the vertex without intravenous contrast. COMPARISON:  None. FINDINGS: Brain: Mass lesion is present in the left posterior frontal and parietal lobe measuring 3.4 x 2.6 by 2.8 cm. Hyperdense material anteriorly is compatible with acute hemorrhage. A second lesion is present in the posterior right parietal lobe measuring up to 1.4 cm. Vasogenic edema surrounds both lesions. There is some local mass effect in the posterior left frontal lobe and parietal  lobe with effacement of the sulci. No significant midline shift is present. The basal ganglia are intact. No acute or focal cortical abnormalities are present. White matter is otherwise unremarkable. Vascular: No hyperdense vessel or unexpected calcification. Skull: Calvarium is intact. No focal lytic or blastic lesions are present. No significant extracranial soft tissue lesion is present. Sinuses/Orbits: Polyp or mucous retention cyst occludes the right ostiomeatal complex. Scattered opacification of ethmoid air cells is noted bilaterally. The globes and orbits are within normal limits. IMPRESSION: 1. At least 2 mass lesions identified. The largest is in the posterior left frontal lobe and parietal lobe. Acute hemorrhage is noted in the anterior aspect of this lesion. 2. Second lesion identified within the right parietal lobe. 3. These likely reflect metastatic lesions. 4. No additional acute hemorrhage or focal cortical infarct. Critical Value/emergent results were called by  telephone at the time of interpretation on 08/02/2020 at 12:25 pm to provider Dr. Curly Shores, who verbally acknowledged these results. Electronically Signed   By: San Morelle M.D.   On: 08/02/2020 12:29    ELIGIBLE FOR AVAILABLE RESEARCH PROTOCOL:   ASSESSMENT: 70 y.o. [place] man presenting with aphasia and found to have 2 brain lesions, one hemorrhagic, in the setting of a prior history of cutaneous melanoma; also with a very elevated PSA and MRI evidence of prostate cancer.  PLAN: Johnathen will go to the OR on 4/27 for left craniotomy for resection of his tumor.  Will await final pathology and will discuss results with him once pathology is available to [place].  The patient had 1 episode of perirectal numbness and a small amount of stool incontinence.  The symptoms have now resolved.  His neuro exam is intact.  This is unlikely meningeal carcinomatosis (unlikely with melanoma and no evidence of carcinomatosis on MRI).  Would recommend monitoring for recurrent symptoms.  Additionally, the patient has prostate cancer which is likely localized.  For now, we are holding on further work-up and treatment to attend to his brain lesions.  He will follow-up for his prostate cancer once he gets back home.   Mikey Bussing, NP   08/09/2020 3:46 PM

## 2020-08-09 NOTE — Progress Notes (Signed)
   Subjective:   No overnight events.   Bryan Hobbs denies any acute complaints today, including difficulty walking, dizziness, headache. He states he has not had any other occurrences of peri-rectal numbness. He thinks the initial episode may have been from the small size of the stool.   Objective:  Vital signs in last 24 hours: Vitals:   08/08/20 1615 08/08/20 2000 08/09/20 0034 08/09/20 0459  BP: 119/73 106/70 104/62 117/78  Pulse:  64 (!) 52 (!) 50  Resp: 15 20 20 20   Temp: 97.6 F (36.4 C) 97.8 F (36.6 C) (!) 97.3 F (36.3 C) 98.1 F (36.7 C)  TempSrc: Oral Oral Oral Oral  SpO2: 94% 96% 94% 94%  Weight:      Height:        Physical Exam Constitutional: no acute distress Head: atraumatic ENT: external ears normal Eyes: EOMI Pulmonary: effort normal Abdominal: flat Skin: warm and dry, numerous diffuse nevi, actinic keratosis, seborrheic keratosis, and hemangiomas Neurological: alert.  Speech with mild aphasic elements, much improved compared to admission Psychiatric: normal mood and affect  Assessment/Plan: Bryan Hobbs is a 70 y.o. male with hx of hypertension and melanoma presenting for acute onset aphasia, CT head concerning for 2 new masses with hemorrhage concerning for metastatic melanoma. Prostate MRI also highly suspicious for cancer but unlikely to be the primary with no other mets on pan scan.   Active Problems:   Brain lesion   Aphasia   Elevated PSA   Prostate mass   Aortic atherosclerosis (HCC)  Brain mass x2 with hemorrhage, concern for metastatic melanoma Aphasia, right upper extremity weakness, right lower extremity weakness CT and MRI show two mass lesions involving his posterior left frontal lobe and parietal lobe with acute hemorrhage in one of the lesions. Hx of melanoma x3. CT abd, pelvis, and chest without evidence of other tumor. No know immunocompromising conditions, HIV negative.   - Neurology, neurosurgery, and oncology  following - Plan for resection on Wednesday - Continue Decadron for cerebral edema - Keppra and Ativan per neurology - Neuro checks    Cervical and Lumbar Spinal Stenosis  We were concerned for new lumbar myelopathy with his new saddle anesthesia, but he states this has since improved. He thinks that he may not have felt the stool due to small size. CT scan stenosis, no evidence of cancer in the lumbar area. Message send to neurosurgeon via the clinic office, but symptoms now resolved.  - monitor symptoms  Prostate cancer, high suspicion MRI prostate with PIRADS category V lesion. PSA 7.1 in February, 18.9 this admission. Spoke with urology who do not believe this is related as his pan scan did not show any mets, recommend outpatient biopsy. - follow up outpatient for prostate biopsy  CKD stage IIIa Creatinine 1.5 on admission, which seems close to his baseline. - Avoid nephrotoxins - Follow-up microalbumin creatinine ratio   Diet: Dysphagia 3 IVF:  none VTE:  SCD Prior to Admission Living Arrangement:  home Anticipated Discharge Location:  TBD Barriers to Discharge:  Resection of brain mass planned 4/27 Dispo: TBD  Dr. Edison Simon Internal Medicine PGY-1  Pager: [phone] After 5pm on weekdays and 1pm on weekends: On Call pager [phone]  08/09/2020, 7:14 AM

## 2020-08-09 NOTE — Progress Notes (Signed)
Subjective: The patient is alert and pleasant.  His partner is at the bedside.  Objective: Vital signs in last 24 hours: Temp:  [97.3 F (36.3 C)-98.1 F (36.7 C)] 98.1 F (36.7 C) (04/25 0459) Pulse Rate:  [50-64] 50 (04/25 0459) Resp:  [15-20] 20 (04/25 0459) BP: (104-119)/(62-78) 117/78 (04/25 0459) SpO2:  [94 %-96 %] 94 % (04/25 0459) Estimated body mass index is 34.25 kg/m as calculated from the following:   Height as of this encounter: 5\' 7"  (1.702 m).   Weight as of this encounter: 99.2 kg.   Intake/Output from previous day: 04/24 0701 - 04/25 0700 In: 3 [I.V.:3] Out: -  Intake/Output this shift: No intake/output data recorded.  Physical exam the patient is alert and oriented.  His speech is fairly normal.  He is moving all 4 extremities well.  Lab Results: Recent Labs    08/07/20 0333  WBC 10.5  HGB 16.1  HCT 48.3  PLT 231   BMET Recent Labs    08/07/20 0333  NA 138  K 4.3  CL 107  CO2 21*  GLUCOSE 154*  BUN 32*  CREATININE 1.54*  CALCIUM 9.5    Studies/Results: CT LUMBAR SPINE W CONTRAST  Result Date: 08/07/2020 CLINICAL DATA:  Cauda equina syndrome. Brain mass is concerning for metastatic melanoma. EXAM: CT LUMBAR SPINE WITH CONTRAST TECHNIQUE: Multidetector CT imaging of the lumbar spine was performed with intravenous contrast administration. CONTRAST:  144mL OMNIPAQUE IOHEXOL 300 MG/ML  SOLN COMPARISON:  CT chest, abdomen, and pelvis 08/02/2020 FINDINGS: Segmentation: Transitional lumbosacral anatomy with largely sacralized L5. Alignment: Trace anterolisthesis of L3 on L4. Vertebrae: No fracture or suspicious osseous lesion. Paraspinal and other soft tissues: Partially visualized bilateral renal cysts common small nonobstructing right renal calculus, and hepatic steatosis as seen on the recent prior CT. Mild abdominal aortic atherosclerosis without aneurysm. Disc levels: T10-11: Mild facet arthrosis without evidence of stenosis. T11-12 through L2-3:  Negative. L3-4: Mild disc space narrowing. Circumferential disc bulging and moderate to severe facet and ligamentum flavum hypertrophy result in moderate to severe spinal stenosis and mild bilateral neural foraminal stenosis. L4-5: Moderate disc space narrowing with vacuum disc. Disc bulging, a calcified central disc protrusion, and moderate to severe right and mild-to-moderate left facet hypertrophy result in bilateral lateral recess stenosis and mild bilateral neural foraminal stenosis without significant generalized spinal stenosis. L5-S1: Transitional anatomy with a hypoplastic disc.  No stenosis. IMPRESSION: 1. No evidence of acute osseous abnormality or metastatic disease in the lumbar spine. 2. Lumbar disc and facet degeneration most notable at L3-4 where there is moderate to severe spinal stenosis and mild bilateral neural foraminal stenosis. 3. Bilateral lateral recess stenosis and mild neural foraminal stenosis at L4-5. 4. Aortic Atherosclerosis (ICD10-I70.0). Electronically Signed   By: Logan Bores M.D.   On: 08/07/2020 18:02    Assessment/Plan: Bilateral brain tumors: I have again discussed the situation with the patient.  We discussed the various treatment options including surgery.  I have answered all his questions regarding a left craniotomy for resection of this tumor.  He wants to proceed with surgery as planned on Wednesday.  LOS: 6 days     Ophelia Charter 08/09/2020, 7:49 AM

## 2020-08-10 ENCOUNTER — Inpatient Hospital Stay (HOSPITAL_COMMUNITY): Payer: PRIVATE HEALTH INSURANCE

## 2020-08-10 DIAGNOSIS — Z01810 Encounter for preprocedural cardiovascular examination: Secondary | ICD-10-CM

## 2020-08-10 DIAGNOSIS — C7931 Secondary malignant neoplasm of brain: Secondary | ICD-10-CM | POA: Diagnosis not present

## 2020-08-10 LAB — BASIC METABOLIC PANEL
Anion gap: 10 (ref 5–15)
BUN: 35 mg/dL — ABNORMAL HIGH (ref 8–23)
CO2: 23 mmol/L (ref 22–32)
Calcium: 8.9 mg/dL (ref 8.9–10.3)
Chloride: 103 mmol/L (ref 98–111)
Creatinine, Ser: 1.52 mg/dL — ABNORMAL HIGH (ref 0.61–1.24)
GFR, Estimated: 49 mL/min — ABNORMAL LOW (ref >60)
Glucose, Bld: 108 mg/dL — ABNORMAL HIGH (ref 70–99)
Potassium: 4.9 mmol/L (ref 3.5–5.1)
Sodium: 136 mmol/L (ref 135–145)

## 2020-08-10 LAB — CBC
HCT: 49 % (ref 39.0–52.0)
Hemoglobin: 16.4 g/dL (ref 13.0–17.0)
MCH: 30.3 pg (ref 26.0–34.0)
MCHC: 33.5 g/dL (ref 30.0–36.0)
MCV: 90.4 fL (ref 80.0–100.0)
Platelets: 231 10*3/uL (ref 150–400)
RBC: 5.42 MIL/uL (ref 4.22–5.81)
RDW: 12.6 % (ref 11.5–15.5)
WBC: 12.1 10*3/uL — ABNORMAL HIGH (ref 4.0–10.5)
nRBC: 0 % (ref 0.0–0.2)

## 2020-08-10 LAB — ECHOCARDIOGRAM COMPLETE
Area-P 1/2: 2.91 cm2
Height: 67 in
S' Lateral: 1.9 cm
Weight: 3499.14 oz

## 2020-08-10 MED ORDER — CHLORHEXIDINE GLUCONATE CLOTH 2 % EX PADS
6.0000 | MEDICATED_PAD | Freq: Once | CUTANEOUS | Status: AC
  Administered 2020-08-10: 6 via TOPICAL

## 2020-08-10 MED ORDER — CHLORHEXIDINE GLUCONATE CLOTH 2 % EX PADS
6.0000 | MEDICATED_PAD | Freq: Once | CUTANEOUS | Status: AC
  Administered 2020-08-11: 6 via TOPICAL

## 2020-08-10 NOTE — Progress Notes (Signed)
   Subjective:   No overnight events.   Denies any new symptoms since previous day. Still having some weakness of R hand. Speech seems improved. Discussed his upcoming surgery with patient and his partner.   Objective:  Vital signs in last 24 hours: Vitals:   08/09/20 1410 08/09/20 2030 08/09/20 2344 08/10/20 0417  BP:  116/76 105/64 130/84  Pulse:  61 (!) 50 (!) 57  Resp: 18 20 20 20   Temp: 97.8 F (36.6 C) (!) 97.5 F (36.4 C) 98.1 F (36.7 C) 98 F (36.7 C)  TempSrc: Oral Oral Oral Oral  SpO2:  94% 94% 95%  Weight:      Height:        Physical Exam Constitutional: no acute distress Head: atraumatic ENT: external ears normal Eyes: EOMI Pulmonary: effort normal Abdominal: flat Skin: warm and dry, numerous diffuse nevi, actinic keratosis, seborrheic keratosis, and hemangiomas Neurological: alert.  Speech with mild aphasic elements, much improved compared to admission. R facial droop. RUE is subtly weaker than the left especially in the distal compartments, 4/5 strength. Otherwise 5/5 strength throughout. No dysmetria. Gait is stable. Psychiatric: normal mood and affect  Assessment/Plan: Bryan Hobbs is a 70 y.o. male with hx of hypertension and melanoma presenting for acute onset aphasia, CT head concerning for 2 new masses with hemorrhage concerning for metastatic melanoma. Prostate MRI also highly suspicious for cancer but unlikely to be the primary with no other mets on pan scan.   Active Problems:   Brain lesion   Aphasia   Elevated PSA   Prostate mass   Aortic atherosclerosis (HCC)   Brain metastases (HCC)  Brain mass x2 with hemorrhage, concern for metastatic melanoma Aphasia, right upper extremity weakness, right lower extremity weakness CT and MRI show two mass lesions involving his posterior left frontal lobe and parietal lobe with acute hemorrhage in one of the lesions. Hx of melanoma x3. CT abd, pelvis, and chest without evidence of other tumor. No  know immunocompromising conditions, HIV negative.   - Neurology, neurosurgery, and oncology following - Plan for resection tomorrow - Continue Decadron for cerebral edema - Keppra and Ativan per neurology - Neuro checks   - benign echo for pre-op evalation  Cervical and Lumbar Spinal Stenosis  Patient noted a stool which he did not feel passing, but this has not since recurred. States this may have been due to small size of the stool. - monitor symptoms  Prostate cancer, high suspicion MRI prostate with PIRADS category V lesion. PSA 7.1 in February, 18.9 this admission. Spoke with urology who do not believe this is related as his pan scan did not show any mets, recommend outpatient biopsy. - follow up outpatient for prostate biopsy  CKD stage IIIa Creatinine 1.5 on admission, which seems close to his baseline. - Avoid nephrotoxins - Follow-up microalbumin creatinine ratio   Diet: Dysphagia 3, NPO after midnight IVF:  none VTE:  SCD Prior to Admission Living Arrangement:  home Anticipated Discharge Location:  TBD Barriers to Discharge:  Resection of brain mass planned 4/27 Dispo: TBD  Dr. Edison Simon Internal Medicine PGY-1  Pager: [phone] After 5pm on weekdays and 1pm on weekends: On Call pager [phone]  08/10/2020, 6:28 AM

## 2020-08-10 NOTE — Progress Notes (Signed)
  Echocardiogram 2D Echocardiogram has been performed.  Bryan Hobbs 08/10/2020, 11:57 AM

## 2020-08-10 NOTE — Progress Notes (Signed)
Subjective: The patient is alert and pleasant.  He is in no apparent distress.  I was contacted regarding a previous episode of fecal incontinence.  The patient told me it was a small amount and occurred about a week ago when he was a week.  He denies lower extremity numbness and perineal, tingling, weakness, etc.  Objective: Vital signs in last 24 hours: Temp:  [97.5 F (36.4 C)-98.1 F (36.7 C)] 98 F (36.7 C) (04/26 0417) Pulse Rate:  [50-61] 57 (04/26 0417) Resp:  [18-20] 20 (04/26 0417) BP: (105-130)/(64-86) 130/84 (04/26 0417) SpO2:  [94 %-95 %] 95 % (04/26 0417) Estimated body mass index is 34.25 kg/m as calculated from the following:   Height as of this encounter: 5\' 7"  (1.702 m).   Weight as of this encounter: 99.2 kg.   Intake/Output from previous day: 04/25 0701 - 04/26 0700 In: 3 [I.V.:3] Out: -  Intake/Output this shift: No intake/output data recorded.  Physical exam patient is alert and oriented x3.  His speech is fairly normal.  He has mild right hemiparasis.  Lab Results: No results for input(s): WBC, HGB, HCT, PLT in the last 72 hours. BMET No results for input(s): NA, K, CL, CO2, GLUCOSE, BUN, CREATININE, CALCIUM in the last 72 hours.  Studies/Results: No results found.  Assessment/Plan: Brain tumors: I have answered all the patient's questions regarding the craniotomy planned for tomorrow.  He wants to proceed.  LOS: 7 days     Ophelia Charter 08/10/2020, 7:55 AM

## 2020-08-11 ENCOUNTER — Inpatient Hospital Stay (HOSPITAL_COMMUNITY): Payer: PRIVATE HEALTH INSURANCE | Admitting: Registered Nurse

## 2020-08-11 ENCOUNTER — Encounter (HOSPITAL_COMMUNITY)
Admission: EM | Disposition: A | Discharge status: Home / Self Care | Payer: Self-pay | Source: Home / Self Care | Attending: Internal Medicine

## 2020-08-11 ENCOUNTER — Encounter (HOSPITAL_COMMUNITY): Payer: Self-pay | Admitting: Internal Medicine

## 2020-08-11 DIAGNOSIS — D496 Neoplasm of unspecified behavior of brain: Secondary | ICD-10-CM | POA: Diagnosis present

## 2020-08-11 HISTORY — PX: CRANIOTOMY: SHX93

## 2020-08-11 HISTORY — PX: APPLICATION OF CRANIAL NAVIGATION: SHX6578

## 2020-08-11 LAB — CBC
HCT: 46.2 % (ref 39.0–52.0)
Hemoglobin: 15.2 g/dL (ref 13.0–17.0)
MCH: 30.5 pg (ref 26.0–34.0)
MCHC: 32.9 g/dL (ref 30.0–36.0)
MCV: 92.6 fL (ref 80.0–100.0)
Platelets: 234 10*3/uL (ref 150–400)
RBC: 4.99 MIL/uL (ref 4.22–5.81)
RDW: 12.8 % (ref 11.5–15.5)
WBC: 12.2 10*3/uL — ABNORMAL HIGH (ref 4.0–10.5)
nRBC: 0 % (ref 0.0–0.2)

## 2020-08-11 LAB — SURGICAL PCR SCREEN
MRSA, PCR: NEGATIVE
Staphylococcus aureus: NEGATIVE

## 2020-08-11 LAB — TYPE AND SCREEN
ABO/RH(D): A POS
Antibody Screen: NEGATIVE

## 2020-08-11 LAB — BASIC METABOLIC PANEL
Anion gap: 10 (ref 5–15)
BUN: 37 mg/dL — ABNORMAL HIGH (ref 8–23)
CO2: 24 mmol/L (ref 22–32)
Calcium: 8.8 mg/dL — ABNORMAL LOW (ref 8.9–10.3)
Chloride: 103 mmol/L (ref 98–111)
Creatinine, Ser: 1.54 mg/dL — ABNORMAL HIGH (ref 0.61–1.24)
GFR, Estimated: 49 mL/min — ABNORMAL LOW (ref >60)
Glucose, Bld: 115 mg/dL — ABNORMAL HIGH (ref 70–99)
Potassium: 4.3 mmol/L (ref 3.5–5.1)
Sodium: 137 mmol/L (ref 135–145)

## 2020-08-11 LAB — ABO/RH: ABO/RH(D): A POS

## 2020-08-11 SURGERY — CRANIOTOMY TUMOR EXCISION
Anesthesia: General

## 2020-08-11 MED ORDER — THROMBIN 20000 UNITS EX SOLR
CUTANEOUS | Status: AC
  Filled 2020-08-11: qty 20000

## 2020-08-11 MED ORDER — ROCURONIUM BROMIDE 10 MG/ML (PF) SYRINGE
PREFILLED_SYRINGE | INTRAVENOUS | Status: AC
  Filled 2020-08-11: qty 10

## 2020-08-11 MED ORDER — ACETAMINOPHEN 10 MG/ML IV SOLN
INTRAVENOUS | Status: AC
  Filled 2020-08-11: qty 100

## 2020-08-11 MED ORDER — HYDRALAZINE HCL 20 MG/ML IJ SOLN
INTRAMUSCULAR | Status: AC
  Filled 2020-08-11: qty 1

## 2020-08-11 MED ORDER — THROMBIN 5000 UNITS EX SOLR: OROMUCOSAL | Status: DC | PRN

## 2020-08-11 MED ORDER — SUGAMMADEX SODIUM 200 MG/2ML IV SOLN
INTRAVENOUS | Status: DC | PRN
  Administered 2020-08-11: 200 mg via INTRAVENOUS

## 2020-08-11 MED ORDER — ROCURONIUM BROMIDE 10 MG/ML (PF) SYRINGE
PREFILLED_SYRINGE | INTRAVENOUS | Status: DC | PRN
  Administered 2020-08-11: 10 mg via INTRAVENOUS
  Administered 2020-08-11 (×2): 20 mg via INTRAVENOUS
  Administered 2020-08-11: 70 mg via INTRAVENOUS

## 2020-08-11 MED ORDER — BACITRACIN ZINC 500 UNIT/GM EX OINT
TOPICAL_OINTMENT | CUTANEOUS | Status: AC
  Filled 2020-08-11: qty 28.35

## 2020-08-11 MED ORDER — LEVETIRACETAM IN NACL 500 MG/100ML IV SOLN
500.0000 mg | Freq: Two times a day (BID) | INTRAVENOUS | Status: DC
  Administered 2020-08-11 – 2020-08-13 (×3): 500 mg via INTRAVENOUS
  Filled 2020-08-11 (×3): qty 100

## 2020-08-11 MED ORDER — ONDANSETRON HCL 4 MG/2ML IJ SOLN
INTRAMUSCULAR | Status: AC
  Filled 2020-08-11: qty 2

## 2020-08-11 MED ORDER — ONDANSETRON HCL 4 MG/2ML IJ SOLN: 4.0000 mg | Freq: Once | INTRAMUSCULAR | Status: DC | PRN

## 2020-08-11 MED ORDER — ESMOLOL HCL 100 MG/10ML IV SOLN
INTRAVENOUS | Status: AC
  Filled 2020-08-11: qty 10

## 2020-08-11 MED ORDER — PANTOPRAZOLE SODIUM 40 MG IV SOLR
40.0000 mg | Freq: Every day | INTRAVENOUS | Status: DC
  Administered 2020-08-11 – 2020-08-12 (×2): 40 mg via INTRAVENOUS
  Filled 2020-08-11 (×2): qty 40

## 2020-08-11 MED ORDER — LEVETIRACETAM IN NACL 500 MG/100ML IV SOLN
500.0000 mg | INTRAVENOUS | Status: AC
  Administered 2020-08-11: 500 mg via INTRAVENOUS
  Filled 2020-08-11: qty 100

## 2020-08-11 MED ORDER — ACETAMINOPHEN 650 MG RE SUPP: 650.0000 mg | RECTAL | Status: DC | PRN

## 2020-08-11 MED ORDER — CHLORHEXIDINE GLUCONATE CLOTH 2 % EX PADS
6.0000 | MEDICATED_PAD | Freq: Every day | CUTANEOUS | Status: DC
  Administered 2020-08-11 – 2020-08-14 (×3): 6 via TOPICAL

## 2020-08-11 MED ORDER — LABETALOL HCL 5 MG/ML IV SOLN: 10.0000 mg | INTRAVENOUS | Status: DC | PRN

## 2020-08-11 MED ORDER — THROMBIN 5000 UNITS EX SOLR
CUTANEOUS | Status: AC
  Filled 2020-08-11: qty 5000

## 2020-08-11 MED ORDER — DEXAMETHASONE SODIUM PHOSPHATE 4 MG/ML IJ SOLN
4.0000 mg | Freq: Three times a day (TID) | INTRAMUSCULAR | Status: DC
  Administered 2020-08-13 – 2020-08-14 (×3): 4 mg via INTRAVENOUS
  Filled 2020-08-11 (×3): qty 1

## 2020-08-11 MED ORDER — VANCOMYCIN HCL 1000 MG IV SOLR
INTRAVENOUS | Status: DC | PRN
  Administered 2020-08-11: 1000 mg via INTRAVENOUS

## 2020-08-11 MED ORDER — HEMOSTATIC AGENTS (NO CHARGE) OPTIME
TOPICAL | Status: DC | PRN
  Administered 2020-08-11: 1 via TOPICAL

## 2020-08-11 MED ORDER — MORPHINE SULFATE (PF) 2 MG/ML IV SOLN: 2.0000 mg | INTRAVENOUS | Status: DC | PRN

## 2020-08-11 MED ORDER — ONDANSETRON HCL 4 MG/2ML IJ SOLN: 4.0000 mg | INTRAMUSCULAR | Status: DC | PRN

## 2020-08-11 MED ORDER — PROPOFOL 10 MG/ML IV BOLUS
INTRAVENOUS | Status: AC
  Filled 2020-08-11: qty 20

## 2020-08-11 MED ORDER — PROMETHAZINE HCL 25 MG PO TABS
12.5000 mg | ORAL_TABLET | ORAL | Status: DC | PRN
  Filled 2020-08-11: qty 1

## 2020-08-11 MED ORDER — OXYCODONE HCL 5 MG PO TABS: 5.0000 mg | ORAL_TABLET | Freq: Once | ORAL | Status: DC | PRN

## 2020-08-11 MED ORDER — HYDROCODONE-ACETAMINOPHEN 5-325 MG PO TABS: 1.0000 | ORAL_TABLET | ORAL | Status: DC | PRN

## 2020-08-11 MED ORDER — MUPIROCIN 2 % EX OINT
1.0000 "application " | TOPICAL_OINTMENT | Freq: Two times a day (BID) | CUTANEOUS | Status: DC
  Administered 2020-08-11: 1 via NASAL
  Filled 2020-08-11: qty 22

## 2020-08-11 MED ORDER — FENTANYL CITRATE (PF) 250 MCG/5ML IJ SOLN
INTRAMUSCULAR | Status: DC | PRN
  Administered 2020-08-11: 50 ug via INTRAVENOUS
  Administered 2020-08-11: 100 ug via INTRAVENOUS

## 2020-08-11 MED ORDER — DOCUSATE SODIUM 100 MG PO CAPS
100.0000 mg | ORAL_CAPSULE | Freq: Two times a day (BID) | ORAL | Status: DC
  Administered 2020-08-11 – 2020-08-14 (×5): 100 mg via ORAL
  Filled 2020-08-11 (×6): qty 1

## 2020-08-11 MED ORDER — SODIUM CHLORIDE 0.9 % IV SOLN: INTRAVENOUS | Status: DC | PRN

## 2020-08-11 MED ORDER — BACITRACIN ZINC 500 UNIT/GM EX OINT
TOPICAL_OINTMENT | CUTANEOUS | Status: DC | PRN
  Administered 2020-08-11: 1 via TOPICAL

## 2020-08-11 MED ORDER — ESMOLOL HCL 100 MG/10ML IV SOLN
INTRAVENOUS | Status: DC | PRN
  Administered 2020-08-11 (×2): 10 mg via INTRAVENOUS

## 2020-08-11 MED ORDER — DEXAMETHASONE SODIUM PHOSPHATE 10 MG/ML IJ SOLN
6.0000 mg | Freq: Four times a day (QID) | INTRAMUSCULAR | Status: AC
  Administered 2020-08-11 – 2020-08-12 (×4): 6 mg via INTRAVENOUS
  Filled 2020-08-11 (×4): qty 1

## 2020-08-11 MED ORDER — SODIUM CHLORIDE 0.9 % IV SOLN: INTRAVENOUS | Status: DC

## 2020-08-11 MED ORDER — DEXAMETHASONE SODIUM PHOSPHATE 4 MG/ML IJ SOLN
4.0000 mg | Freq: Four times a day (QID) | INTRAMUSCULAR | Status: AC
  Administered 2020-08-13 (×3): 4 mg via INTRAVENOUS
  Filled 2020-08-11 (×3): qty 1

## 2020-08-11 MED ORDER — LIDOCAINE 2% (20 MG/ML) 5 ML SYRINGE
INTRAMUSCULAR | Status: DC | PRN
  Administered 2020-08-11: 60 mg via INTRAVENOUS

## 2020-08-11 MED ORDER — VANCOMYCIN HCL IN DEXTROSE 1-5 GM/200ML-% IV SOLN: 1000.0000 mg | INTRAVENOUS | Status: AC

## 2020-08-11 MED ORDER — FENTANYL CITRATE (PF) 100 MCG/2ML IJ SOLN: 25.0000 ug | INTRAMUSCULAR | Status: DC | PRN

## 2020-08-11 MED ORDER — DEXAMETHASONE SODIUM PHOSPHATE 10 MG/ML IJ SOLN
INTRAMUSCULAR | Status: AC
  Filled 2020-08-11: qty 1

## 2020-08-11 MED ORDER — DEXAMETHASONE SODIUM PHOSPHATE 10 MG/ML IJ SOLN
INTRAMUSCULAR | Status: DC | PRN
  Administered 2020-08-11: 10 mg via INTRAVENOUS

## 2020-08-11 MED ORDER — ACETAMINOPHEN 325 MG PO TABS: 650.0000 mg | ORAL_TABLET | ORAL | Status: DC | PRN

## 2020-08-11 MED ORDER — ONDANSETRON HCL 4 MG PO TABS: 4.0000 mg | ORAL_TABLET | ORAL | Status: DC | PRN

## 2020-08-11 MED ORDER — POTASSIUM CHLORIDE IN NACL 20-0.9 MEQ/L-% IV SOLN
INTRAVENOUS | Status: DC
  Filled 2020-08-11 (×3): qty 1000

## 2020-08-11 MED ORDER — ONDANSETRON HCL 4 MG/2ML IJ SOLN
INTRAMUSCULAR | Status: DC | PRN
  Administered 2020-08-11: 4 mg via INTRAVENOUS

## 2020-08-11 MED ORDER — FENTANYL CITRATE (PF) 250 MCG/5ML IJ SOLN
INTRAMUSCULAR | Status: AC
  Filled 2020-08-11: qty 5

## 2020-08-11 MED ORDER — ARTIFICIAL TEARS OPHTHALMIC OINT
TOPICAL_OINTMENT | OPHTHALMIC | Status: DC | PRN
  Administered 2020-08-11: 1 via OPHTHALMIC

## 2020-08-11 MED ORDER — CHLORHEXIDINE GLUCONATE 0.12 % MT SOLN
OROMUCOSAL | Status: AC
  Administered 2020-08-11: 15 mL via OROMUCOSAL
  Filled 2020-08-11: qty 15

## 2020-08-11 MED ORDER — BUPIVACAINE-EPINEPHRINE 0.5% -1:200000 IJ SOLN
INTRAMUSCULAR | Status: DC | PRN
  Administered 2020-08-11: 10 mL

## 2020-08-11 MED ORDER — VANCOMYCIN HCL 750 MG/150ML IV SOLN
750.0000 mg | Freq: Two times a day (BID) | INTRAVENOUS | Status: AC
  Administered 2020-08-11 – 2020-08-12 (×2): 750 mg via INTRAVENOUS
  Filled 2020-08-11 (×2): qty 150

## 2020-08-11 MED ORDER — VANCOMYCIN HCL 1000 MG/200ML IV SOLN: 1000.0000 mg | INTRAVENOUS | Status: DC

## 2020-08-11 MED ORDER — ACETAMINOPHEN 10 MG/ML IV SOLN
INTRAVENOUS | Status: DC | PRN
  Administered 2020-08-11: 1000 mg via INTRAVENOUS

## 2020-08-11 MED ORDER — CHLORHEXIDINE GLUCONATE 0.12 % MT SOLN: 15.0000 mL | Freq: Once | OROMUCOSAL | Status: AC

## 2020-08-11 MED ORDER — VANCOMYCIN HCL IN DEXTROSE 1-5 GM/200ML-% IV SOLN
INTRAVENOUS | Status: AC
  Administered 2020-08-11: 1000 mg via INTRAVENOUS
  Filled 2020-08-11: qty 200

## 2020-08-11 MED ORDER — 0.9 % SODIUM CHLORIDE (POUR BTL) OPTIME
TOPICAL | Status: DC | PRN
  Administered 2020-08-11 (×3): 1000 mL

## 2020-08-11 MED ORDER — OXYCODONE HCL 5 MG/5ML PO SOLN: 5.0000 mg | Freq: Once | ORAL | Status: DC | PRN

## 2020-08-11 MED ORDER — ORAL CARE MOUTH RINSE: 15.0000 mL | Freq: Once | OROMUCOSAL | Status: AC

## 2020-08-11 MED ORDER — LIDOCAINE 2% (20 MG/ML) 5 ML SYRINGE
INTRAMUSCULAR | Status: AC
  Filled 2020-08-11: qty 5

## 2020-08-11 MED ORDER — BUPIVACAINE-EPINEPHRINE (PF) 0.25% -1:200000 IJ SOLN
INTRAMUSCULAR | Status: AC
  Filled 2020-08-11: qty 30

## 2020-08-11 MED ORDER — PHENYLEPHRINE HCL-NACL 10-0.9 MG/250ML-% IV SOLN
INTRAVENOUS | Status: DC | PRN
  Administered 2020-08-11: 30 ug/min via INTRAVENOUS
  Administered 2020-08-11: 15 ug/min via INTRAVENOUS

## 2020-08-11 MED ORDER — PROPOFOL 10 MG/ML IV BOLUS
INTRAVENOUS | Status: DC | PRN
  Administered 2020-08-11: 10 mg via INTRAVENOUS
  Administered 2020-08-11: 130 mg via INTRAVENOUS
  Administered 2020-08-11: 50 mg via INTRAVENOUS
  Administered 2020-08-11: 10 mg via INTRAVENOUS

## 2020-08-11 MED ORDER — THROMBIN 20000 UNITS EX SOLR: CUTANEOUS | Status: DC | PRN

## 2020-08-11 SURGICAL SUPPLY — 78 items
BAND RUBBER #18 3X1/16 STRL (MISCELLANEOUS) ×6 IMPLANT
BATTERY IQ STERILE (MISCELLANEOUS) ×3 IMPLANT
BIT DRILL WIRE PASS 1.3MM (BIT) IMPLANT
BLADE CLIPPER SPEC (BLADE) IMPLANT
BLADE ULTRA TIP 2M (BLADE) IMPLANT
BUR PRECISION FLUTE 6.0 (BURR) ×3 IMPLANT
BUR SPIRAL ROUTER 2.3 (BUR) ×3 IMPLANT
CANISTER SUCT 3000ML PPV (MISCELLANEOUS) ×6 IMPLANT
CARTRIDGE OIL MAESTRO DRILL (MISCELLANEOUS) ×2 IMPLANT
CATH VENTRIC 35X38 W/TROCAR LG (CATHETERS) IMPLANT
CLIP VESOCCLUDE MED 6/CT (CLIP) IMPLANT
CNTNR URN SCR LID CUP LEK RST (MISCELLANEOUS) ×2 IMPLANT
CONT SPEC 4OZ STRL OR WHT (MISCELLANEOUS) ×3
COVER BACK TABLE 60X90IN (DRAPES) IMPLANT
COVER WAND RF STERILE (DRAPES) IMPLANT
DIFFUSER DRILL AIR PNEUMATIC (MISCELLANEOUS) ×3 IMPLANT
DRAPE MICROSCOPE LEICA (MISCELLANEOUS) ×3 IMPLANT
DRAPE NEUROLOGICAL W/INCISE (DRAPES) ×3 IMPLANT
DRAPE SURG 17X23 STRL (DRAPES) IMPLANT
DRAPE WARM FLUID 44X44 (DRAPES) ×3 IMPLANT
DRILL WIRE PASS 1.3MM (BIT)
DRSG OPSITE POSTOP 4X6 (GAUZE/BANDAGES/DRESSINGS) ×3 IMPLANT
ELECT REM PT RETURN 9FT ADLT (ELECTROSURGICAL) ×3
ELECTRODE REM PT RTRN 9FT ADLT (ELECTROSURGICAL) ×2 IMPLANT
EVACUATOR 1/8 PVC DRAIN (DRAIN) IMPLANT
EVACUATOR SILICONE 100CC (DRAIN) IMPLANT
FORCEPS BIPOLAR SPETZLER 8 1.0 (NEUROSURGERY SUPPLIES) ×3 IMPLANT
GAUZE 4X4 16PLY RFD (DISPOSABLE) IMPLANT
GAUZE SPONGE 4X4 12PLY STRL (GAUZE/BANDAGES/DRESSINGS) IMPLANT
GLOVE BIO SURGEON STRL SZ8 (GLOVE) ×6 IMPLANT
GLOVE BIO SURGEON STRL SZ8.5 (GLOVE) ×3 IMPLANT
GLOVE EXAM NITRILE XL STR (GLOVE) IMPLANT
GLOVE SURG LTX SZ6.5 (GLOVE) ×3 IMPLANT
GLOVE SURG UNDER POLY LF SZ6.5 (GLOVE) ×12 IMPLANT
GOWN STRL REUS W/ TWL LRG LVL3 (GOWN DISPOSABLE) ×4 IMPLANT
GOWN STRL REUS W/ TWL XL LVL3 (GOWN DISPOSABLE) ×2 IMPLANT
GOWN STRL REUS W/TWL LRG LVL3 (GOWN DISPOSABLE) ×6
GOWN STRL REUS W/TWL XL LVL3 (GOWN DISPOSABLE) ×3
HEMOSTAT POWDER SURGIFOAM 1G (HEMOSTASIS) ×3 IMPLANT
HEMOSTAT SURGICEL 2X14 (HEMOSTASIS) ×3 IMPLANT
KIT BASIN OR (CUSTOM PROCEDURE TRAY) ×3 IMPLANT
KIT DRAIN CSF ACCUDRAIN (MISCELLANEOUS) IMPLANT
KIT TURNOVER KIT B (KITS) ×3 IMPLANT
KNIFE ARACHNOID DISP AM-23-XSB (BLADE) ×3 IMPLANT
MARKER SKIN DUAL TIP RULER LAB (MISCELLANEOUS) ×9 IMPLANT
MARKER SPHERE PSV REFLC 13MM (MARKER) ×6 IMPLANT
NEEDLE HYPO 22GX1.5 SAFETY (NEEDLE) ×3 IMPLANT
NS IRRIG 1000ML POUR BTL (IV SOLUTION) ×3 IMPLANT
OIL CARTRIDGE MAESTRO DRILL (MISCELLANEOUS) ×3
PACK CRANIOTOMY CUSTOM (CUSTOM PROCEDURE TRAY) ×3 IMPLANT
PAD ARMBOARD 7.5X6 YLW CONV (MISCELLANEOUS) ×6 IMPLANT
PATTIES SURGICAL .25X.25 (GAUZE/BANDAGES/DRESSINGS) IMPLANT
PATTIES SURGICAL .5 X.5 (GAUZE/BANDAGES/DRESSINGS) IMPLANT
PATTIES SURGICAL .5 X3 (DISPOSABLE) ×3 IMPLANT
PATTIES SURGICAL 1X1 (DISPOSABLE) IMPLANT
PIN MAYFIELD SKULL DISP (PIN) IMPLANT
PLATE CRANIAL 12 2H RIGID UNI (Plate) ×9 IMPLANT
SCREW UNIII AXS SD 1.5X4 (Screw) ×18 IMPLANT
SPECIMEN JAR SMALL (MISCELLANEOUS) IMPLANT
SPONGE NEURO XRAY DETECT 1X3 (DISPOSABLE) IMPLANT
SPONGE SURGIFOAM ABS GEL 100 (HEMOSTASIS) ×6 IMPLANT
STAPLER SKIN PROX WIDE 3.9 (STAPLE) ×3 IMPLANT
STOCKINETTE 6  STRL (DRAPES)
STOCKINETTE 6 STRL (DRAPES) IMPLANT
SUT ETHILON 3 0 FSL (SUTURE) IMPLANT
SUT ETHILON 3 0 PS 1 (SUTURE) IMPLANT
SUT NURALON 4 0 TR CR/8 (SUTURE) ×6 IMPLANT
SUT PROLENE 6 0 BV (SUTURE) IMPLANT
SUT SILK 0 TIES 10X30 (SUTURE) IMPLANT
SUT VIC AB 2-0 CP2 18 (SUTURE) ×3 IMPLANT
SUT VIC AB 3-0 FS2 27 (SUTURE) IMPLANT
SUT VICRYL 4-0 PS2 18IN ABS (SUTURE) IMPLANT
TOWEL GREEN STERILE (TOWEL DISPOSABLE) ×3 IMPLANT
TOWEL GREEN STERILE FF (TOWEL DISPOSABLE) ×3 IMPLANT
TRAY FOLEY MTR SLVR 16FR STAT (SET/KITS/TRAYS/PACK) IMPLANT
TUBE CONNECTING 12X1/4 (SUCTIONS) ×3 IMPLANT
UNDERPAD 30X36 HEAVY ABSORB (UNDERPADS AND DIAPERS) IMPLANT
WATER STERILE IRR 1000ML POUR (IV SOLUTION) ×3 IMPLANT

## 2020-08-11 NOTE — Progress Notes (Signed)
Pharmacy Antibiotic Note  Bryan Hobbs is a 70 y.o. male admitted on 08/02/2020 with aphasia and R hemiparesis, found to have 2 brain tumors now s/p resection. Pharmacy has been consulted for vancomycin dosing x 24hrs for surgical prophylaxis post-op. Patient received pre-op vancomycin 1g IV dose at 0943 this morning. SCr 1.54 stable.  Plan: Vancomycin 750mg  IV q12h to complete 24hrs post-op Monitor clinical progress, renal function    Height: 5\' 7"  (170.2 cm) Weight: 99.2 kg (218 lb 11.1 oz) IBW/kg (Calculated) : 66.1  Temp (24hrs), Avg:97.8 F (36.6 C), Min:97.4 F (36.3 C), Max:98.7 F (37.1 C)  Recent Labs  Lab 08/05/20 0748 08/06/20 0455 08/07/20 0333 08/10/20 0700 08/11/20 0347  WBC 11.7* 8.8 10.5 12.1* 12.2*  CREATININE 1.76* 1.58* 1.54* 1.52* 1.54*    Estimated Creatinine Clearance: 50.8 mL/min (A) (by C-G formula based on SCr of 1.54 mg/dL (H)).    Allergies  Allergen Reactions  . Peanut-Containing Drug Products Swelling  . Shellfish Allergy Swelling  . Penicillins     He was told by a parent that he had an allergy as a child    Arturo Morton, PharmD, BCPS Please check AMION for all [place] contact numbers Clinical Pharmacist 08/11/2020 2:54 PM

## 2020-08-11 NOTE — Transfer of Care (Signed)
Immediate Anesthesia Transfer of Care Note  Patient: Bryan Hobbs  Procedure(s) Performed: Left craniotomy with brainlab (Left ) APPLICATION OF CRANIAL NAVIGATION (N/A )  Patient Location: PACU  Anesthesia Type:General  Level of Consciousness: drowsy, patient cooperative and responds to stimulation  Airway & Oxygen Therapy: Patient Spontanous Breathing and Patient connected to face mask oxygen  Post-op Assessment: Report given to RN, Post -op Vital signs reviewed and stable and Patient moving all extremities X 4  Post vital signs: Reviewed and stable  Last Vitals:  Vitals Value Taken Time  BP 150/80 08/11/20 1325  Temp    Pulse 51 08/11/20 1332  Resp 12 08/11/20 1332  SpO2 99 % 08/11/20 1332  Vitals shown include unvalidated device data.  Last Pain:  Vitals:   08/11/20 0730  TempSrc:   PainSc: 0-No pain         Complications: No complications documented.

## 2020-08-11 NOTE — Anesthesia Preprocedure Evaluation (Addendum)
Anesthesia Evaluation  Patient identified by MRN, date of birth, ID band Patient awake    Reviewed: Allergy & Precautions, NPO status , Patient's Chart, lab work & pertinent test results  Airway Mallampati: II  TM Distance: >3 FB Neck ROM: Full    Dental  (+) Teeth Intact, Dental Advisory Given   Pulmonary    breath sounds clear to auscultation       Cardiovascular hypertension,  Rhythm:Regular Rate:Normal     Neuro/Psych    GI/Hepatic   Endo/Other    Renal/GU      Musculoskeletal   Abdominal   Peds  Hematology   Anesthesia Other Findings   Reproductive/Obstetrics                            Anesthesia Physical Anesthesia Plan  ASA: III  Anesthesia Plan: General   Post-op Pain Management:    Induction: Intravenous  PONV Risk Score and Plan: Ondansetron and Dexamethasone  Airway Management Planned: Oral ETT  Additional Equipment: Arterial line  Intra-op Plan:   Post-operative Plan: Possible Post-op intubation/ventilation  Informed Consent: I have reviewed the patients History and Physical, chart, labs and discussed the procedure including the risks, benefits and alternatives for the proposed anesthesia with the patient or authorized representative who has indicated his/her understanding and acceptance.     Dental advisory given  Plan Discussed with: CRNA and Anesthesiologist  Anesthesia Plan Comments:         Anesthesia Quick Evaluation

## 2020-08-11 NOTE — Progress Notes (Signed)
Subjective: The patient is alert and pleasant.  He is ready for surgery.  Objective: Vital signs in last 24 hours: Temp:  [97.4 F (36.3 C)-98.7 F (37.1 C)] 98 F (36.7 C) (04/27 0435) Pulse Rate:  [57-66] 61 (04/27 0435) Resp:  [18-20] 18 (04/27 0435) BP: (118-130)/(59-81) 129/80 (04/27 0435) SpO2:  [94 %-98 %] 94 % (04/27 0435) Estimated body mass index is 34.25 kg/m as calculated from the following:   Height as of this encounter: 5\' 7"  (1.702 m).   Weight as of this encounter: 99.2 kg.   Intake/Output from previous day: No intake/output data recorded. Intake/Output this shift: No intake/output data recorded.  Physical exam the patient is alert and oriented.  His speech is normal.  He is mildly right hemiparetic.  Lab Results: Recent Labs    08/10/20 0700 08/11/20 0347  WBC 12.1* 12.2*  HGB 16.4 15.2  HCT 49.0 46.2  PLT 231 234   BMET Recent Labs    08/10/20 0700 08/11/20 0347  NA 136 137  K 4.9 4.3  CL 103 103  CO2 23 24  GLUCOSE 108* 115*  BUN 35* 37*  CREATININE 1.52* 1.54*  CALCIUM 8.9 8.8*    Studies/Results: ECHOCARDIOGRAM COMPLETE  Result Date: 08/10/2020    ECHOCARDIOGRAM REPORT   Patient Name:   Bryan Hobbs Date of Exam: 08/10/2020 Medical Rec #:  [id number]       Height:       67.0 in Accession #:    [id number]      Weight:       218.7 lb Date of Birth:  04-10-1951       BSA:          2.101 m Patient Age:    70 years        BP:           141/86 mmHg Patient Gender: M               HR:           57 bpm. Exam Location:  Inpatient Procedure: 2D Echo Indications:    Pre-operative Cardiovascular Examination Z01.810  History:        Patient has no prior history of Echocardiogram examinations.                 Risk Factors:Hypertension.  Sonographer:    Mikki Santee RDCS (AE) Referring Phys: [id number] [place]  1. Left ventricular ejection fraction, by estimation, is 60 to 65%. The left ventricle has normal function. The left  ventricle has no regional wall motion abnormalities. Left ventricular diastolic parameters were normal.  2. Right ventricular systolic function is normal. The right ventricular size is normal.  3. The mitral valve is normal in structure. No evidence of mitral valve regurgitation. No evidence of mitral stenosis.  4. The aortic valve is normal in structure. Aortic valve regurgitation is trivial. No aortic stenosis is present.  5. The inferior vena cava is normal in size with greater than 50% respiratory variability, suggesting right atrial pressure of 3 mmHg. FINDINGS  Left Ventricle: Left ventricular ejection fraction, by estimation, is 60 to 65%. The left ventricle has normal function. The left ventricle has no regional wall motion abnormalities. The left ventricular internal cavity size was normal in size. There is  no left ventricular hypertrophy. Left ventricular diastolic parameters were normal. Normal left ventricular filling pressure. Right Ventricle: The right ventricular size is normal. No increase in right ventricular wall thickness. Right ventricular  systolic function is normal. Left Atrium: Left atrial size was normal in size. Right Atrium: Right atrial size was normal in size. Pericardium: There is no evidence of pericardial effusion. Mitral Valve: The mitral valve is normal in structure. No evidence of mitral valve regurgitation. No evidence of mitral valve stenosis. Tricuspid Valve: The tricuspid valve is normal in structure. Tricuspid valve regurgitation is not demonstrated. No evidence of tricuspid stenosis. Aortic Valve: The aortic valve is normal in structure. Aortic valve regurgitation is trivial. No aortic stenosis is present. Pulmonic Valve: The pulmonic valve was grossly normal. Pulmonic valve regurgitation is not visualized. No evidence of pulmonic stenosis. Aorta: The aortic root is normal in size and structure. Venous: The inferior vena cava is normal in size with greater than 50%  respiratory variability, suggesting right atrial pressure of 3 mmHg. IAS/Shunts: No atrial level shunt detected by color flow Doppler.  LEFT VENTRICLE PLAX 2D LVIDd:         2.70 cm  Diastology LVIDs:         1.90 cm  LV e' medial:    7.29 cm/s LV PW:         0.90 cm  LV E/e' medial:  7.0 LV IVS:        1.10 cm  LV e' lateral:   11.60 cm/s LVOT diam:     2.10 cm  LV E/e' lateral: 4.4 LV SV:         73 LV SV Index:   35 LVOT Area:     3.46 cm  RIGHT VENTRICLE RV S prime:     10.10 cm/s TAPSE (M-mode): 2.0 cm LEFT ATRIUM           Index       RIGHT ATRIUM          Index LA diam:      2.60 cm 1.24 cm/m  RA Area:     8.60 cm LA Vol (A4C): 22.9 ml 10.90 ml/m RA Volume:   13.80 ml 6.57 ml/m  AORTIC VALVE LVOT Vmax:   121.00 cm/s LVOT Vmean:  74.600 cm/s LVOT VTI:    0.210 m  AORTA Ao Root diam: 3.40 cm MITRAL VALVE MV Area (PHT): 2.91 cm    SHUNTS MV Decel Time: 261 msec    Systemic VTI:  0.21 m MV E velocity: 51.00 cm/s  Systemic Diam: 2.10 cm MV A velocity: 69.40 cm/s MV E/A ratio:  0.73 Mihai Croitoru MD Electronically signed by Sanda Klein MD Signature Date/Time: 08/10/2020/1:02:53 PM    Final     Assessment/Plan: Left brain tumor: I have answered all the patient's questions.  He wants to proceed with surgery.  LOS: 8 days     Ophelia Charter 08/11/2020, 9:18 AM

## 2020-08-11 NOTE — Op Note (Signed)
Brief history: The patient is a 70 year old white male who presented with aphasia and right hemiparasis.  He was found to have 2 brain tumors a larger on the left.  He underwent a CT of the chest abdomen pelvis which did not demonstrate any other lesions except his prostate.  He has a history of melanoma and prostate cancer.  We discussed the various treatment options.  I described a left craniotomy for tumor resection.  We discussed the risk, benefits, alternatives, expected postop course, and likelihood of achieving our goals with surgery.  I have answered all his questions.  He has decided proceed with surgery.  Preop diagnosis: Brain tumor  Postop diagnosis: Same procedure: Left parietal craniotomy for gross total resection of brain tumor using microdissection and BrainLab neuro navigation  Surgeon: Dr. Earle Gell  Assistant: Dr. Ashok Pall and Arnetha Massy, NP  Anesthesia: General tracheal  Estimated blood loss: 100 cc  Specimens: Tumor  Drains: None  Complications: None  Description of procedure: The patient's preoperative brain MRI was entered into the [facility] computer.  The patient was brought to the operating room by the anesthesia team.  General endotracheal anesthesia was induced.  I then applied the [place] three-point headrest to the patient's calvarium.  A roll was placed under his left shoulder and his head was turned to the right exposing his left scalp.  We entered the surface coordinants into the [place] computer.  The patient's left scalp was then shaved with clippers and prepared with Betadine scrub and Betadine solution.  Sterile drapes were applied.  I then injected the area to be incised with Marcaine with epinephrine solution.  I used a scalpel to make a linear incision over the patient's left parietal region, centered over the tumor.  We used Raney clips for wound edge hemostasis.  I used the cerebellar retractor for exposure.  I used a high-speed drill to  create a left parietal bur hole.  We used the footplate device to create a left parietal craniotomy flap.  I elevated the craniotomy flap with a Penfield #1 exposing the underlying dura.  I then applied the buddy halo for brain retraction during the resection.  We confirmed the good position of the craniotomy flap using the BrainLab neuro navigation.  I then incised the dura with the 15 blade scalpel in a cruciate fashion.  We tacked up the dural edges.  The brain was abnormal appearing/hemorrhagic.  We brought the operative microscope into the field and under instrumentation and illumination completed the microdissection.  I used bipolar electrocautery, the 15 blade scalpel and suction and irrigation to create a corticotomy over the tumor.  We dissected deeper with suction and bipolar cautery and encountered tumor.  We obtain specimens for frozen section and more specimens for permanent sections.  I used suction to internally debulk the tumor.  To my surprise the tumor was largely solid with a small amount of cystic fluid.  This gave [place] room to work around the tumor's edges.  Using bipolar cautery and suction to dissect around the tumor's edges.  We encountered some hemorrhage around the tumor as expected.  We remove the blood with suction and irrigation.  We remove the remainder of the tumor in multiple fragments.  We were able to obtain a gross total resection of the tumor.  We then used bipolar cautery to obtain hemostasis.  We checked the extent of the resection cavity using the BrainLab neuro navigation.  We then irrigated the wound out with saline  solution.  We lined the tumor resection cavity with Surgicel.  We then reapproximated the patient's dura with interrupted 4-0 Nurolon suture.  We laid a piece of Gelfoam over the exposed dura.  We then replaced the craniotomy flap with titanium mini plates and screws.  We then remove the cerebellar retractor and reapproximated the patient's galea with  interrupted 2-0 Vicryl suture.  We reapproximate the skin with stainless steel staples.  The wound was then coated with bacitracin ointment.  A sterile dressing was applied.  The drapes were removed.  I then remove the [place] three-point headrest from his calvarium.  By report all sponge, instrument, and needle counts were correct at the end of this case.

## 2020-08-11 NOTE — Progress Notes (Signed)
   Subjective:   No acute events overnight. Doing well this morning, no new neurologic symptoms. Looking forward to getting surgery done today.   Objective:  Vital signs in last 24 hours: Vitals:   08/10/20 1600 08/10/20 2101 08/10/20 2319 08/11/20 0435  BP: 118/81 125/79 (!) 130/59 129/80  Pulse: (!) 58 66 (!) 58 61  Resp: 18 20 19 18   Temp: 98.7 F (37.1 C) (!) 97.4 F (36.3 C) (!) 97.4 F (36.3 C) 98 F (36.7 C)  TempSrc: Oral Oral Oral Oral  SpO2: 98% 94% 94% 94%  Weight:      Height:        Physical Exam Constitutional: no acute distress Head: atraumatic ENT: external ears normal Eyes: EOMI Pulmonary: effort normal Abdominal: flat Skin: warm and dry, numerous diffuse nevi, actinic keratosis, seborrheic keratosis, and hemangiomas Neurological: alert.  Speech with mild aphasic elements, much improved compared to admission. R facial droop. RUE is subtly weaker than the left especially in the distal compartments, 4/5 strength. Otherwise 5/5 strength throughout. No dysmetria. Gait is stable. Psychiatric: normal mood and affect  Assessment/Plan: Bryan Hobbs is a 70 y.o. male with hx of hypertension and melanoma presenting for acute onset aphasia, CT head concerning for 2 new masses with hemorrhage concerning for metastatic melanoma. Prostate MRI also highly suspicious for cancer but unlikely to be the primary with no other mets on pan scan.   Active Problems:   Brain lesion   Aphasia   Elevated PSA   Prostate mass   Aortic atherosclerosis (HCC)   Brain metastases (HCC)  Brain mass x2 with hemorrhage, concern for metastatic melanoma Aphasia, right upper extremity weakness, right lower extremity weakness CT and MRI show two mass lesions involving his posterior left frontal lobe and parietal lobe with acute hemorrhage in one of the lesions. Hx of melanoma x3. CT abd, pelvis, and chest without evidence of other tumor. No know immunocompromising conditions, HIV  negative.   - Neurology, neurosurgery, and oncology following - Plan for resection this morning - Continue Decadron for cerebral edema - Keppra and Ativan per neurology - Neuro checks    Cervical and Lumbar Spinal Stenosis  Patient noted a stool which he did not feel passing, but this has not since recurred. States this may have been due to small size of the stool. - monitor symptoms  Prostate cancer, high suspicion MRI prostate with PIRADS category V lesion. PSA 7.1 in February, 18.9 this admission. Spoke with urology who do not believe this is related as his pan scan did not show any mets, recommend outpatient biopsy. - follow up outpatient for prostate biopsy  CKD stage IIIa Creatinine 1.5 on admission, which seems close to his baseline. - Avoid nephrotoxins - Follow-up microalbumin creatinine ratio   Diet: NPO for surgery IVF:  none VTE:  SCD Prior to Admission Living Arrangement:  home Anticipated Discharge Location:  TBD Barriers to Discharge:  Medical management Dispo: TBD  Dr. Edison Simon Internal Medicine PGY-1  Pager: [phone] After 5pm on weekdays and 1pm on weekends: On Call pager [phone]  08/11/2020, 7:22 AM

## 2020-08-11 NOTE — Progress Notes (Signed)
Subjective: The patient is alert and pleasant.  He is in no apparent distress.  He looks well.  Objective: Vital signs in last 24 hours: Temp:  [97.4 F (36.3 C)-98.7 F (37.1 C)] 97.6 F (36.4 C) (04/27 1325) Pulse Rate:  [54-66] 55 (04/27 1340) Resp:  [15-20] 15 (04/27 1340) BP: (118-150)/(59-83) 131/83 (04/27 1340) SpO2:  [94 %-99 %] 95 % (04/27 1340) Estimated body mass index is 34.25 kg/m as calculated from the following:   Height as of this encounter: 5\' 7"  (1.702 m).   Weight as of this encounter: 99.2 kg.   Intake/Output from previous day: No intake/output data recorded. Intake/Output this shift: Total I/O In: 1700 [I.V.:1250; IV Piggyback:450] Out: 450 [Urine:450]  Physical exam the patient is alert and pleasant.  He is mildly right hemiparetic and dysarthric.  Lab Results: Recent Labs    08/10/20 0700 08/11/20 0347  WBC 12.1* 12.2*  HGB 16.4 15.2  HCT 49.0 46.2  PLT 231 234   BMET Recent Labs    08/10/20 0700 08/11/20 0347  NA 136 137  K 4.9 4.3  CL 103 103  CO2 23 24  GLUCOSE 108* 115*  BUN 35* 37*  CREATININE 1.52* 1.54*  CALCIUM 8.9 8.8*    Studies/Results: ECHOCARDIOGRAM COMPLETE  Result Date: 08/10/2020    ECHOCARDIOGRAM REPORT   Patient Name:   Bryan Hobbs Date of Exam: 08/10/2020 Medical Rec #:  [id number]       Height:       67.0 in Accession #:    [id number]      Weight:       218.7 lb Date of Birth:  07/02/1950       BSA:          2.101 m Patient Age:    38 years        BP:           141/86 mmHg Patient Gender: M               HR:           57 bpm. Exam Location:  Inpatient Procedure: 2D Echo Indications:    Pre-operative Cardiovascular Examination Z01.810  History:        Patient has no prior history of Echocardiogram examinations.                 Risk Factors:Hypertension.  Sonographer:    Mikki Santee RDCS (AE) Referring Phys: [id number] [place]  1. Left ventricular ejection fraction, by estimation, is 60 to 65%.  The left ventricle has normal function. The left ventricle has no regional wall motion abnormalities. Left ventricular diastolic parameters were normal.  2. Right ventricular systolic function is normal. The right ventricular size is normal.  3. The mitral valve is normal in structure. No evidence of mitral valve regurgitation. No evidence of mitral stenosis.  4. The aortic valve is normal in structure. Aortic valve regurgitation is trivial. No aortic stenosis is present.  5. The inferior vena cava is normal in size with greater than 50% respiratory variability, suggesting right atrial pressure of 3 mmHg. FINDINGS  Left Ventricle: Left ventricular ejection fraction, by estimation, is 60 to 65%. The left ventricle has normal function. The left ventricle has no regional wall motion abnormalities. The left ventricular internal cavity size was normal in size. There is  no left ventricular hypertrophy. Left ventricular diastolic parameters were normal. Normal left ventricular filling pressure. Right Ventricle: The right ventricular size is normal. No  increase in right ventricular wall thickness. Right ventricular systolic function is normal. Left Atrium: Left atrial size was normal in size. Right Atrium: Right atrial size was normal in size. Pericardium: There is no evidence of pericardial effusion. Mitral Valve: The mitral valve is normal in structure. No evidence of mitral valve regurgitation. No evidence of mitral valve stenosis. Tricuspid Valve: The tricuspid valve is normal in structure. Tricuspid valve regurgitation is not demonstrated. No evidence of tricuspid stenosis. Aortic Valve: The aortic valve is normal in structure. Aortic valve regurgitation is trivial. No aortic stenosis is present. Pulmonic Valve: The pulmonic valve was grossly normal. Pulmonic valve regurgitation is not visualized. No evidence of pulmonic stenosis. Aorta: The aortic root is normal in size and structure. Venous: The inferior vena cava  is normal in size with greater than 50% respiratory variability, suggesting right atrial pressure of 3 mmHg. IAS/Shunts: No atrial level shunt detected by color flow Doppler.  LEFT VENTRICLE PLAX 2D LVIDd:         2.70 cm  Diastology LVIDs:         1.90 cm  LV e' medial:    7.29 cm/s LV PW:         0.90 cm  LV E/e' medial:  7.0 LV IVS:        1.10 cm  LV e' lateral:   11.60 cm/s LVOT diam:     2.10 cm  LV E/e' lateral: 4.4 LV SV:         73 LV SV Index:   35 LVOT Area:     3.46 cm  RIGHT VENTRICLE RV S prime:     10.10 cm/s TAPSE (M-mode): 2.0 cm LEFT ATRIUM           Index       RIGHT ATRIUM          Index LA diam:      2.60 cm 1.24 cm/m  RA Area:     8.60 cm LA Vol (A4C): 22.9 ml 10.90 ml/m RA Volume:   13.80 ml 6.57 ml/m  AORTIC VALVE LVOT Vmax:   121.00 cm/s LVOT Vmean:  74.600 cm/s LVOT VTI:    0.210 m  AORTA Ao Root diam: 3.40 cm MITRAL VALVE MV Area (PHT): 2.91 cm    SHUNTS MV Decel Time: 261 msec    Systemic VTI:  0.21 m MV E velocity: 51.00 cm/s  Systemic Diam: 2.10 cm MV A velocity: 69.40 cm/s MV E/A ratio:  0.73 Bryan Croitoru MD Electronically signed by Sanda Klein MD Signature Date/Time: 08/10/2020/1:02:53 PM    Final     Assessment/Plan: Is post tumor resection: The patient is doing well.  I spoke with [name].  LOS: 8 days     Ophelia Charter 08/11/2020, 1:52 PM

## 2020-08-11 NOTE — Anesthesia Procedure Notes (Signed)
Procedure Name: Intubation Date/Time: 08/11/2020 10:13 AM Performed by: Betha Loa, CRNA Pre-anesthesia Checklist: Patient identified, Emergency Drugs available, Suction available and Patient being monitored Patient Re-evaluated:Patient Re-evaluated prior to induction Oxygen Delivery Method: Circle System Utilized Preoxygenation: Pre-oxygenation with 100% oxygen Induction Type: IV induction Ventilation: Oral airway inserted - appropriate to patient size and Two handed mask ventilation required Laryngoscope Size: Mac and 4 Grade View: Grade II Tube type: Oral Tube size: 7.5 mm Number of attempts: 1 Airway Equipment and Method: Stylet and Oral airway Placement Confirmation: ETT inserted through vocal cords under direct vision,  positive ETCO2 and breath sounds checked- equal and bilateral Secured at: 22 cm Tube secured with: Tape Dental Injury: Teeth and Oropharynx as per pre-operative assessment

## 2020-08-11 NOTE — Anesthesia Postprocedure Evaluation (Signed)
Anesthesia Post Note  Patient: Bryan Hobbs  Procedure(s) Performed: Left craniotomy with brainlab (Left ) APPLICATION OF CRANIAL NAVIGATION (N/A )     Patient location during evaluation: PACU Anesthesia Type: General Level of consciousness: awake and oriented Pain management: pain level controlled Vital Signs Assessment: post-procedure vital signs reviewed and stable Respiratory status: spontaneous breathing, nonlabored ventilation and respiratory function stable Cardiovascular status: blood pressure returned to baseline Postop Assessment: no headache Anesthetic complications: no   No complications documented.  Last Vitals:  Vitals:   08/11/20 2000 08/11/20 2100  BP: 130/81 137/84  Pulse: (!) 57 60  Resp: 14 (!) 26  Temp: 36.8 C   SpO2: 92% 94%    Last Pain:  Vitals:   08/11/20 2000  TempSrc: Oral  PainSc: 0-No pain                 Saia Derossett COKER

## 2020-08-11 NOTE — Anesthesia Procedure Notes (Signed)
Arterial Line Insertion Start/End4/27/2022 9:35 AM, 08/11/2020 9:40 AM Performed by: Betha Loa, CRNA, CRNA  Patient location: Pre-op. Preanesthetic checklist: patient identified, IV checked, site marked, risks and benefits discussed, surgical consent, monitors and equipment checked, pre-op evaluation, timeout performed and anesthesia consent Lidocaine 1% used for infiltration Right, radial was placed Catheter size: 20 G Hand hygiene performed  and maximum sterile barriers used   Attempts: 1 Procedure performed without using ultrasound guided technique. Following insertion, Biopatch and dressing applied. Post procedure assessment: normal  Patient tolerated the procedure well with no immediate complications.

## 2020-08-12 ENCOUNTER — Inpatient Hospital Stay (HOSPITAL_COMMUNITY): Payer: PRIVATE HEALTH INSURANCE

## 2020-08-12 ENCOUNTER — Encounter (HOSPITAL_COMMUNITY)
Admission: EM | Disposition: A | Discharge status: Home / Self Care | Payer: Self-pay | Source: Home / Self Care | Attending: Internal Medicine

## 2020-08-12 ENCOUNTER — Inpatient Hospital Stay (HOSPITAL_COMMUNITY): Payer: PRIVATE HEALTH INSURANCE | Admitting: Certified Registered Nurse Anesthetist

## 2020-08-12 ENCOUNTER — Encounter (HOSPITAL_COMMUNITY): Payer: Self-pay | Admitting: Neurosurgery

## 2020-08-12 DIAGNOSIS — R972 Elevated prostate specific antigen [PSA]: Secondary | ICD-10-CM | POA: Diagnosis not present

## 2020-08-12 DIAGNOSIS — D496 Neoplasm of unspecified behavior of brain: Secondary | ICD-10-CM | POA: Diagnosis not present

## 2020-08-12 DIAGNOSIS — R4701 Aphasia: Secondary | ICD-10-CM | POA: Diagnosis not present

## 2020-08-12 HISTORY — PX: RADIOLOGY WITH ANESTHESIA: SHX6223

## 2020-08-12 IMAGING — MR MR HEAD WO/W CM
7 of 14 series · 22 of 48 positions shown · IV contrast (10 ML GAD)
Comparison: Preoperative MRI [DATE]

CLINICAL DATA: Intracranial mass post resection

EXAM:
MRI HEAD WITHOUT AND WITH CONTRAST
TECHNIQUE: Multiplanar, multiecho pulse sequences of the brain and surrounding
structures were obtained without and with intravenous contrast.
CONTRAST:  10mL GADAVIST GADOBUTROL 1 MMOL/ML IV SOLN

[Series 2: DWI · axial · 3.0mm · 1.02mm/px · z∈[-49,+104]mm · 6 of 106 slices shown (1 of 2)]
[im 1/106]
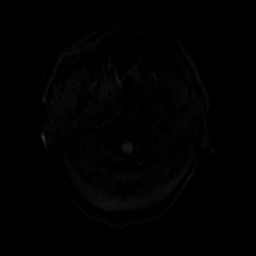
[im 22/106]
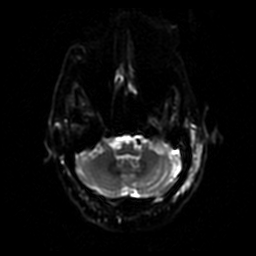
[im 43/106]
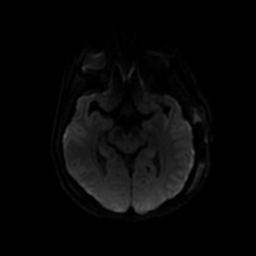
[im 64/106]
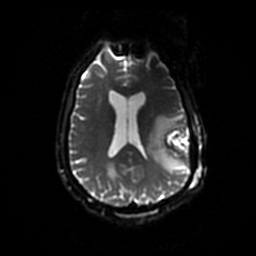
[im 85/106]
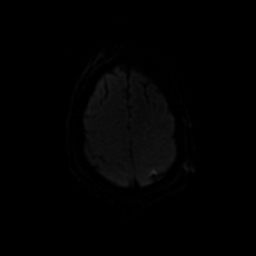
[im 106/106]
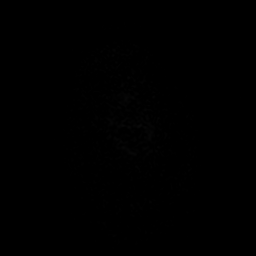

[Series 3: DWI · coronal · 5.0mm · 1.02mm/px · 5 of 74 slices shown (2 of 2)]
[im 1/74]
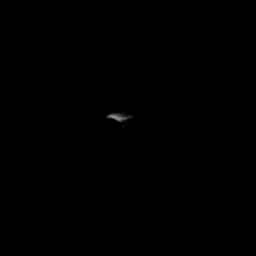
[im 19/74]
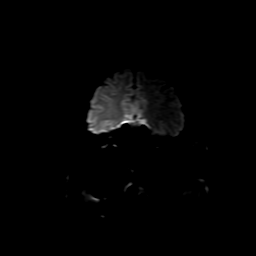
[im 37/74]
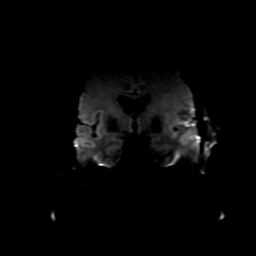
[im 55/74]
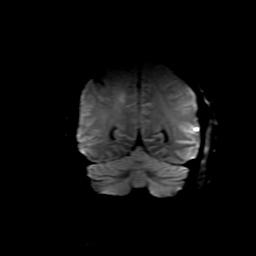
[im 74/74]
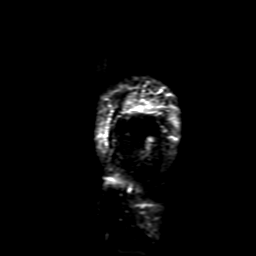

[Series 4: FLAIR · sagittal · 5.0mm · 0.23mm/px · 2 of 29 slices shown (1 of 2)]
[im 1/29]
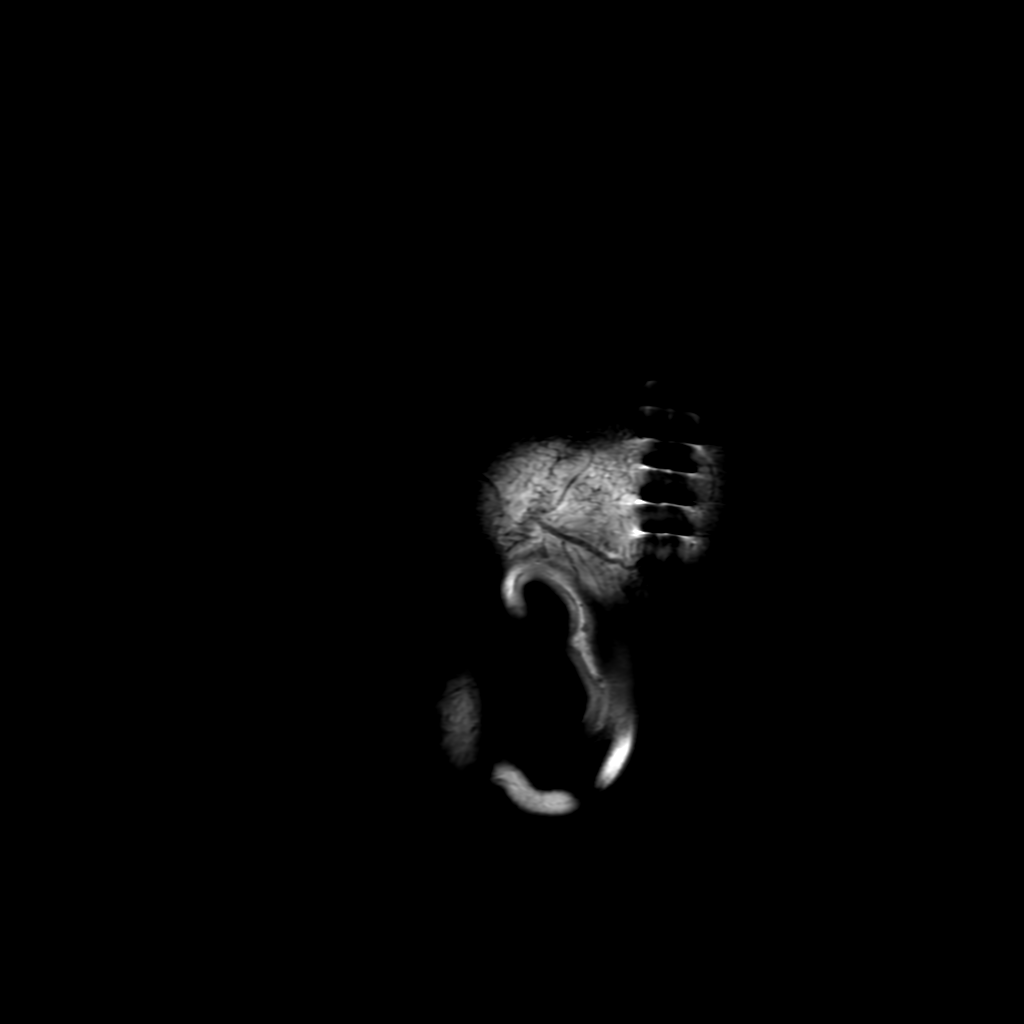
[im 29/29]
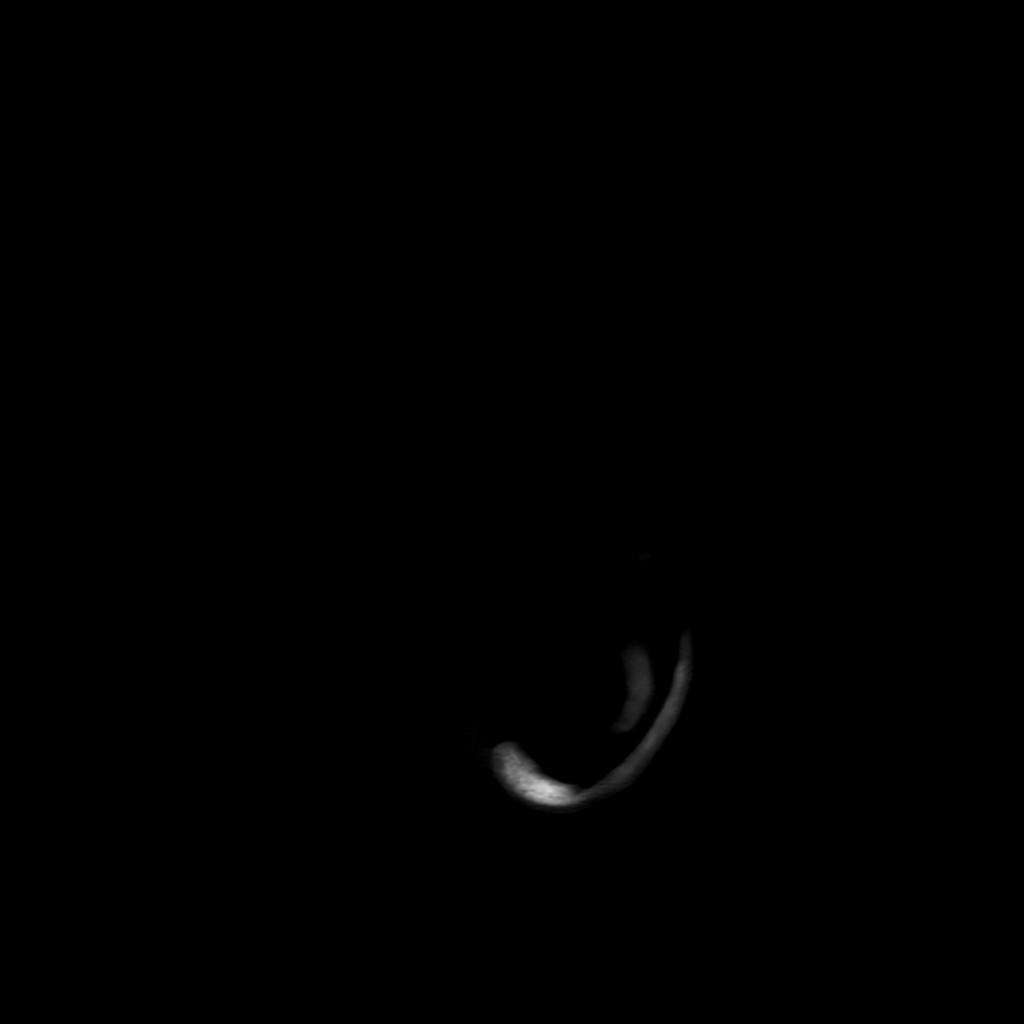

[Series 6: FLAIR · axial · 3.0mm · 0.41mm/px · z∈[-36,+119]mm · 2 of 27 slices shown (2 of 2)]
[im 1/27]
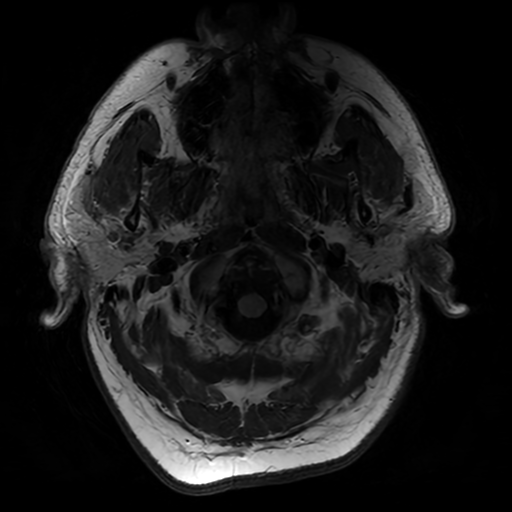
[im 27/27]
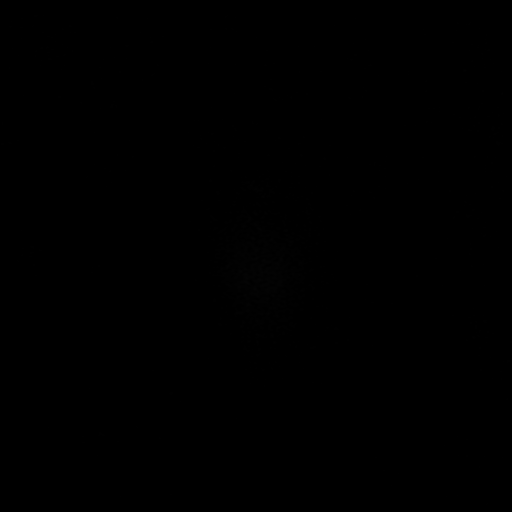

[Series 12: FLAIR post-contrast · sagittal · 5.0mm · 0.47mm/px · 2 of 29 slices shown]
[im 1/29]
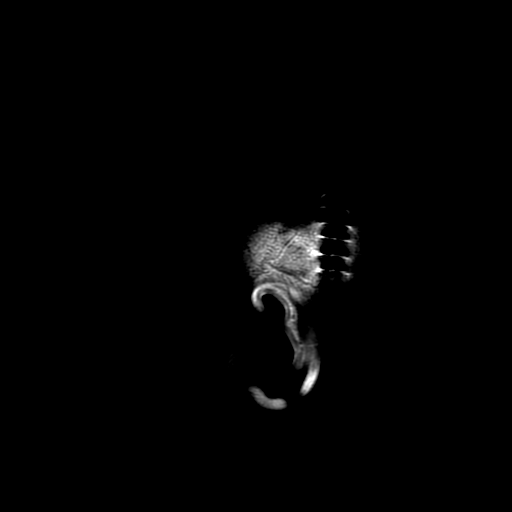
[im 29/29]
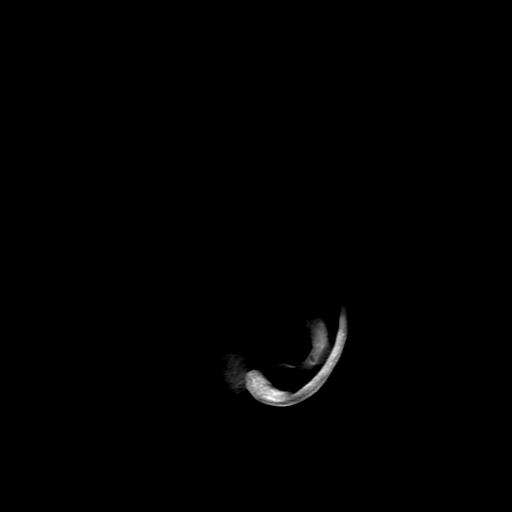

[Series 250: ADC · axial · 3.0mm · 1.02mm/px · z∈[-49,+104]mm · 3 of 53 slices shown (1 of 2)]
[im 1/53]
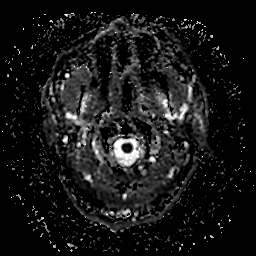
[im 27/53]
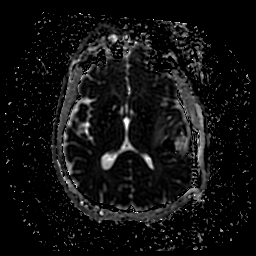
[im 53/53]
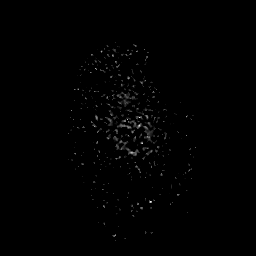

[Series 350: ADC · coronal · 5.0mm · 1.02mm/px · 2 of 37 slices shown (2 of 2)]
[im 1/37]
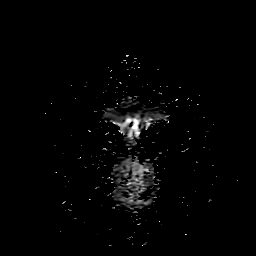
[im 37/37]
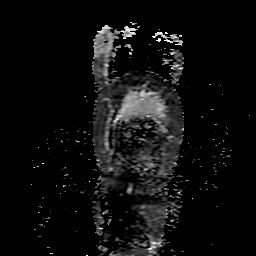

[22 of 48 positions shown; findings below may reference images not displayed]

FINDINGS: Brain: New postoperative changes are present. A left lateral
frontoparietal resection cavity is present containing fluid and
blood products. There is reduced diffusion at the surgical cavity
margins, some of which is artifactual due to susceptibility.
Additional focus is present deep to this region along the margin of
the left lateral ventricle but was also present preoperatively and
may reflect an infarct. T1 shortening is already present at the
surgical cavity margins somewhat limiting evaluation for
enhancement. Greatest enhancement is present at the superior and
posterior margins. Degree of surrounding edema and regional mass
effect without substantial change.

Stable two small adjacent enhancing lesions of the parasagittal
right parietal lobe with stable surrounding edema.

Vascular: Major vessel flow voids at the skull base are preserved.

Skull and upper cervical spine: Normal marrow signal is preserved.

Sinuses/Orbits: Mild mucosal thickening.  Orbits are unremarkable.

Other: Sella is unremarkable.  Mastoid air cells are clear.
IMPRESSION: Expected postoperative changes of gross total resection of lateral
left frontoparietal mass. Attention on follow-up recommended for
enhancement primarily at the superior and posterior margins.

## 2020-08-12 SURGERY — MRI WITH ANESTHESIA
Anesthesia: General

## 2020-08-12 MED ORDER — MIDAZOLAM HCL 2 MG/2ML IJ SOLN
INTRAMUSCULAR | Status: AC
  Filled 2020-08-12: qty 2

## 2020-08-12 MED ORDER — CHLORHEXIDINE GLUCONATE 0.12 % MT SOLN
OROMUCOSAL | Status: AC
  Administered 2020-08-12: 15 mL via OROMUCOSAL
  Filled 2020-08-12: qty 15

## 2020-08-12 MED ORDER — DEXAMETHASONE SODIUM PHOSPHATE 4 MG/ML IJ SOLN
INTRAMUSCULAR | Status: DC | PRN
  Administered 2020-08-12: 4 mg via INTRAVENOUS

## 2020-08-12 MED ORDER — PHENYLEPHRINE HCL (PRESSORS) 10 MG/ML IV SOLN: INTRAVENOUS | Status: DC | PRN

## 2020-08-12 MED ORDER — ONDANSETRON HCL 4 MG/2ML IJ SOLN
INTRAMUSCULAR | Status: DC | PRN
  Administered 2020-08-12: 4 mg via INTRAVENOUS

## 2020-08-12 MED ORDER — LACTATED RINGERS IV SOLN: INTRAVENOUS | Status: DC | PRN

## 2020-08-12 MED ORDER — PROPOFOL 10 MG/ML IV BOLUS
INTRAVENOUS | Status: DC | PRN
  Administered 2020-08-12: 150 mg via INTRAVENOUS

## 2020-08-12 MED ORDER — ORAL CARE MOUTH RINSE: 15.0000 mL | Freq: Once | OROMUCOSAL | Status: AC

## 2020-08-12 MED ORDER — CHLORHEXIDINE GLUCONATE 0.12 % MT SOLN: 15.0000 mL | Freq: Once | OROMUCOSAL | Status: AC

## 2020-08-12 MED ORDER — PHENYLEPHRINE HCL-NACL 10-0.9 MG/250ML-% IV SOLN
INTRAVENOUS | Status: DC | PRN
  Administered 2020-08-12: 15 ug/min via INTRAVENOUS

## 2020-08-12 MED ORDER — ONDANSETRON HCL 4 MG/2ML IJ SOLN: 4.0000 mg | Freq: Once | INTRAMUSCULAR | Status: DC | PRN

## 2020-08-12 MED ORDER — OXYCODONE HCL 5 MG/5ML PO SOLN: 5.0000 mg | Freq: Once | ORAL | Status: DC | PRN

## 2020-08-12 MED ORDER — SODIUM CHLORIDE 0.9 % IV SOLN: INTRAVENOUS | Status: DC

## 2020-08-12 MED ORDER — FENTANYL CITRATE (PF) 100 MCG/2ML IJ SOLN: 25.0000 ug | INTRAMUSCULAR | Status: DC | PRN

## 2020-08-12 MED ORDER — GADOBUTROL 1 MMOL/ML IV SOLN
10.0000 mL | Freq: Once | INTRAVENOUS | Status: AC | PRN
  Administered 2020-08-12: 10 mL via INTRAVENOUS

## 2020-08-12 MED ORDER — OXYCODONE HCL 5 MG PO TABS: 5.0000 mg | ORAL_TABLET | Freq: Once | ORAL | Status: DC | PRN

## 2020-08-12 MED ORDER — MIDAZOLAM HCL 5 MG/5ML IJ SOLN
INTRAMUSCULAR | Status: DC | PRN
  Administered 2020-08-12: 2 mg via INTRAVENOUS

## 2020-08-12 MED ORDER — LIDOCAINE HCL (CARDIAC) PF 100 MG/5ML IV SOSY
PREFILLED_SYRINGE | INTRAVENOUS | Status: DC | PRN
  Administered 2020-08-12: 40 mg via INTRAVENOUS

## 2020-08-12 NOTE — Evaluation (Signed)
Speech Language Pathology Evaluation Patient Details Name: Bryan Hobbs MRN: [id number] DOB: 1950-10-24 Today's Date: 08/12/2020 Time: 6440-3474 SLP Time Calculation (min) (ACUTE ONLY): 60 min  Problem List:  Patient Active Problem List   Diagnosis Date Noted  . Brain tumor ([place]) 08/11/2020  . Brain metastases ([place])   . Aortic atherosclerosis ([place]) 08/07/2020  . Prostate mass   . Aphasia   . Elevated PSA   . Brain lesion 08/02/2020   Past Medical History:  Past Medical History:  Diagnosis Date  . Hepatitis A   . Hypertension   . Melanoma ([place])   . Pre-diabetes   . Sleep apnea    Past Surgical History:  Past Surgical History:  Procedure Laterality Date  . APPENDECTOMY  2017  . APPLICATION OF CRANIAL NAVIGATION N/A 08/11/2020   Procedure: APPLICATION OF CRANIAL NAVIGATION;  Surgeon: Newman Pies, MD;  Location: [place];  Service: Neurosurgery;  Laterality: N/A;  . CRANIOTOMY Left 08/11/2020   Procedure: Left craniotomy with brainlab;  Surgeon: Newman Pies, MD;  Location: [place];  Service: Neurosurgery;  Laterality: Left;  RM 20  . CYSTOSCOPY W/ URETERAL STENT PLACEMENT    . HERNIA REPAIR    . [place], 2010, 2018  . RADIOLOGY WITH ANESTHESIA N/A 08/04/2020   Procedure: MRI WITH ANESTHESIA;  Surgeon: Radiologist, Medication, MD;  Location: [place];  Service: Radiology;  Laterality: N/A;   HPI:  Pt is a 70 yo male presenting with aphasia and slurred speech. CT Head showed a 3.4 x 2.6 x 2.8 cm lesion in the left posterior frontal and parietal lobe with acute hemorrhage, and another 1.4 cm lesion in the posterior right parietal lobe. Concern is for metastatic lesions, with additional w/u underway. PMH includes: HTN, melanoma x3 s/p surgery, recent dental work (~3 weeks PTA, after which mild changes in his speech may have been noted)   Assessment / Plan / Recommendation Clinical Impression  Pt was re-evaluated s/p resection of brain tumor. Today, pt presents  with intact receptive and expressive language skills with the exception of slight perseveration, which pt catches and redirects himself. Motor speech is characterized by fully intelligible speech, absent of dysarthria. There is no appreciable limb or oral apraxia. Pt exhibits mild-moderate verbal apraxia, characterized by deterioration of accuracy with increased complexity or syllable length, and inconsistent errors of which he is aware. Pt would benefit from continued skilled ST intervention to maximize communicative effectiveness. Pt may benefit from continued ST intervention at DC.    SLP Assessment  SLP Recommendation/Assessment: Patient needs continued Speech Language Pathology Services  SLP Visit Diagnosis: Apraxia (R48.2)    Follow Up Recommendations  Other (comment) (consider continued ST intervention at DC if needed.)    Frequency and Duration min 2x/week  2 weeks      SLP Evaluation Cognition  Overall Cognitive Status:  (appears adequate for tasks assessed) Arousal/Alertness: Awake/alert       Comprehension  Auditory Comprehension Overall Auditory Comprehension: Appears within functional limits for tasks assessed    Expression Expression Primary Mode of Expression: Verbal Verbal Expression Overall Verbal Expression: Impaired Initiation: No impairment Automatic Speech: Name;Social Response;Counting;Day of week;Month of year;Singing (slight difficulty with perseveration, but he usually catches it) Level of Generative/Spontaneous Verbalization: Conversation Repetition: Impaired Level of Impairment:  (difficulty with higher complexity words/phrases due to apraxia) Naming: No impairment Responsive: 76-100% accurate Confrontation: Within functional limits Divergent: 75-100% accurate Verbal Errors: Aware of errors;Phonemic paraphasias Pragmatics: No impairment Non-Verbal Means of Communication: Gestures Written Expression  Dominant Hand: Left   Oral / Motor  Oral  Motor/Sensory Function Overall Oral Motor/Sensory Function: Mild impairment Motor Speech Overall Motor Speech: Impaired Respiration: Within functional limits Phonation: Normal Resonance: Within functional limits Articulation: Impaired Level of Impairment: Phrase Intelligibility: Intelligible Motor Planning: Impaired Level of Impairment: Word Motor Speech Errors: Aware;Inconsistent;Groping for words Effective Techniques: Slow rate;Pause (break words down into single syllables)   GO                   Bryan Hobbs B. Bryan Hobbs, [facility], Bryan Hobbs Speech Language Pathologist Office: [phone]  Bryan Hobbs 08/12/2020, 2:29 PM

## 2020-08-12 NOTE — Progress Notes (Signed)
Update note:  Discussed pathology with Dr Lolly Mustache. She does not think this is melanoma. 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 Tissue will be sent to [facility] for further molecular studies and definitive grading. Do not expect to have those results before 05/06 at the earliest.

## 2020-08-12 NOTE — Anesthesia Preprocedure Evaluation (Signed)
Anesthesia Evaluation  Patient identified by MRN, date of birth, ID band Patient awake    Reviewed: Allergy & Precautions, NPO status , Patient's Chart, lab work & pertinent test results  Airway Mallampati: II  TM Distance: >3 FB Neck ROM: Full    Dental  (+) Teeth Intact   Pulmonary    breath sounds clear to auscultation       Cardiovascular hypertension,  Rhythm:Regular Rate:Normal     Neuro/Psych    GI/Hepatic   Endo/Other    Renal/GU      Musculoskeletal   Abdominal   Peds  Hematology   Anesthesia Other Findings   Reproductive/Obstetrics                             Anesthesia Physical Anesthesia Plan  ASA: III  Anesthesia Plan: General   Post-op Pain Management:    Induction: Intravenous  PONV Risk Score and Plan: Ondansetron and Dexamethasone  Airway Management Planned: LMA  Additional Equipment:   Intra-op Plan:   Post-operative Plan:   Informed Consent: I have reviewed the patients History and Physical, chart, labs and discussed the procedure including the risks, benefits and alternatives for the proposed anesthesia with the patient or authorized representative who has indicated his/her understanding and acceptance.       Plan Discussed with: CRNA and Anesthesiologist  Anesthesia Plan Comments:         Anesthesia Quick Evaluation

## 2020-08-12 NOTE — Progress Notes (Signed)
   Subjective:   Bryan Hobbs feels he is doing well this morning with minimal weakness. He continues to have some tingling in his upper extremities but knows this is from his longstanding cervical spine stenosis.   Objective:  Vital signs in last 24 hours: Vitals:   08/12/20 0300 08/12/20 0400 08/12/20 0500 08/12/20 0600  BP: 116/81 110/66 111/72 108/74  Pulse: (!) 52 (!) 58 (!) 53 (!) 51  Resp: 15 14 16 15   Temp:  97.7 F (36.5 C)    TempSrc:  Oral    SpO2: 94% 95% 95% 94%  Weight:      Height:        Physical Exam Constitutional: no acute distress Head: atraumatic ENT: external ears normal Eyes: EOMI Pulmonary: effort normal Abdominal: flat Skin: warm and dry, numerous diffuse nevi, actinic keratosis, seborrheic keratosis, and hemangiomas Neurological: alert.  Speech with mild aphasic elements, much improved compared to admission. R facial droop. RUE is subtly weaker than the left especially in the distal compartments, 4/5 strength. Otherwise 5/5 strength throughout. No dysmetria. Gait is stable. Psychiatric: normal mood and affect  Assessment/Plan: Bryan Hobbs is a 70 y.o. male with hx of hypertension and melanoma presenting for acute onset aphasia, CT head concerning for 2 new masses with hemorrhage concerning for metastatic melanoma. Prostate MRI also highly suspicious for cancer but unlikely to be the primary with no other mets on pan scan.   Active Problems:   Brain lesion   Aphasia   Elevated PSA   Prostate mass   Aortic atherosclerosis ([facility])   Brain metastases ([facility])   Brain tumor ([facility])  Brain mass x2 with hemorrhage, concern for metastatic melanoma, s/p craniotomy and resection POD 1. CT and MRI show two mass lesions involving his posterior left frontal lobe and parietal lobe with acute hemorrhage in one of the lesions. Hx of melanoma x3. CT abd, pelvis, and chest without evidence of other tumor. No know immunocompromising conditions, HIV negative.   -  Neurology, neurosurgery, and oncology following - Oncology to follow up and see when pathology results are back - Continue Decadron for cerebral edema - Keppra and Ativan per neurology - Neuro checks    Sleep apnea Patient has long known history of sleep apnea but cannot tolerate CPAP mask due to severe claustrophobia. No significant daytime drowsiness. Has nighttime bradycardia into 50s. - continue telemetry  Cervical and Lumbar Spinal Stenosis  Patient noted a stool which he did not feel passing, but this has not since recurred. States this may have been due to small size of the stool. - monitor symptoms  Prostate cancer, high suspicion MRI prostate with PIRADS category V lesion. PSA 7.1 in February, 18.9 this admission. Spoke with urology who do not believe this is related as his pan scan did not show any mets, recommend outpatient biopsy. - follow up outpatient for prostate biopsy  CKD stage IIIa Creatinine 1.5 on admission, which seems close to his baseline. - Avoid nephrotoxins - Follow-up microalbumin creatinine ratio   Diet: heart healthy IVF:  none VTE:  SCD Prior to Admission Living Arrangement:  home Anticipated Discharge Location:  home Barriers to Discharge:  Medical management Dispo: TBD  Dr. Edison Simon Internal Medicine PGY-1  Pager: [phone] After 5pm on weekdays and 1pm on weekends: On Call pager [phone]  08/12/2020, 6:32 AM

## 2020-08-12 NOTE — Progress Notes (Addendum)
[place]  [phone] [phone]     ID: Bryan Hobbs DOB: May 09, 1950  MR#: [id number]  QQP#:[id number]  Patient Care Team: Pcp, No as PCP - General Bryan Bussing, NP OTHER MD:  INTERVAL HISTORY: Bryan Hobbs underwent left parietal craniotomy for gross total resection of the brain tumor on 08/11/2020.  He did well overall with the procedure.  Pathology pending.  REVIEW OF SYSTEMS: Today, the patient is somewhat somnolent.  He does wake up to talk to me.  Slow to respond.  His husband was not at the bedside.  He currently denies headaches and dizziness.  He will have an MRI later today under anesthesia due to claustrophobia.  A detailed review of systems was otherwise noncontributory.  PAST MEDICAL HISTORY: Past Medical History:  Diagnosis Date   Hepatitis A    Hypertension    Melanoma ([place])    Pre-diabetes    Sleep apnea   Hepatic steatosis  PAST SURGICAL HISTORY: Past Surgical History:  Procedure Laterality Date   APPENDECTOMY  [id number]   APPLICATION OF CRANIAL NAVIGATION N/A 08/11/2020   Procedure: APPLICATION OF CRANIAL NAVIGATION;  Surgeon: Bryan Pies, MD;  Location: [facility];  Service: Neurosurgery;  Laterality: N/A;   CRANIOTOMY Left 08/11/2020   Procedure: Left craniotomy with brainlab;  Surgeon: Bryan Pies, MD;  Location: [facility];  Service: Neurosurgery;  Laterality: Left;  RM 20   CYSTOSCOPY W/ URETERAL STENT PLACEMENT     HERNIA REPAIR     MELANOMA EXCISION  1992, 2010, 2018   RADIOLOGY WITH ANESTHESIA N/A 08/04/2020   Procedure: MRI WITH ANESTHESIA;  Surgeon: Radiologist, Medication, MD;  Location: [facility];  Service: Radiology;  Laterality: N/A;    FAMILY HISTORY Family History  Problem Relation Age of Onset   Cancer Sister   The patient does not know the cause of death of his parents--father died age 8, mother age 60. The patient has one brother with a history of skin cancer, unclear type, and a sister who died from cancer,  patient does not know what type. A paternal great grandmother had cancer. 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 He has four children from his first marriage, three surviving. When the patient came out as gay his family cut contact with him. His only communication with them is through a son-in-law who is an MD (he has  let the children know the patient's current situation). Bryan Hobbs married Bryan Hobbs in 2008. Bryan Hobbs is a Animal nutritionist in the local [employer]. Bryan Hobbs is a Psychologist, forensic   ADVANCED DIRECTIVES: in the absence of any documents to the contrary, the patient's husband is his HCPOA  HEALTH MAINTENANCE: Social History   Tobacco Use   Smoking status: Never Smoker  Substance Use Topics   Alcohol use: Yes    Alcohol/week: 4.0 standard drinks    Types: 4 Glasses of wine per week   Drug use: Never     Colonoscopy: UTD  PSA: 18.93 on 08/05/2020  Bone density:   Allergies  Allergen Reactions   Peanut-Containing Drug Products Swelling   Shellfish Allergy Swelling   Penicillins     He was told by a parent that he had an allergy as a child    Current Facility-Administered Medications  Medication Dose Route Frequency Provider Last Rate Last Admin   0.9 %  NaCl with KCl 20 mEq/ L  infusion   Intravenous Continuous Bryan Pies, MD   Paused at 08/12/20 1009   acetaminophen (TYLENOL) tablet 650 mg  650 mg Oral Q4H PRN Bryan Pies, MD       Or   acetaminophen (TYLENOL) suppository 650 mg  650 mg Rectal Q4H PRN Bryan Pies, MD       Chlorhexidine Gluconate Cloth 2 % PADS 6 each  6 each Topical Daily Bryan Pies, MD   6 each at 08/11/20 1557   dexamethasone (DECADRON) injection 6 mg  6 mg Intravenous Q6H Bryan Pies, MD   6 mg at 08/12/20 0530   Followed by    dexamethasone (DECADRON) injection 4 mg  4 mg Intravenous Q6H Bryan Pies, MD       Followed by   Derrill Memo ON 08/13/2020] dexamethasone (DECADRON) injection 4 mg  4 mg Intravenous Q8H Bryan Pies, MD       docusate sodium (COLACE) capsule 100 mg  100 mg Oral BID Bryan Pies, MD   100 mg at 08/12/20 0946   HYDROcodone-acetaminophen (NORCO/VICODIN) 5-325 MG per tablet 1 tablet  1 tablet Oral Q4H PRN Bryan Pies, MD       labetalol (NORMODYNE) injection 10-40 mg  10-40 mg Intravenous Q10 min PRN Bryan Pies, MD       levETIRAcetam (KEPPRA) IVPB 500 mg/100 mL premix  500 mg Intravenous Q12H Bryan Pies, MD   Stopped at 08/12/20 0443   morphine 2 MG/ML injection 2-4 mg  2-4 mg Intravenous Q2H PRN Bryan Pies, MD       ondansetron [facility]) tablet 4 mg  4 mg Oral Q4H PRN Bryan Pies, MD       Or   ondansetron [facility]) injection 4 mg  4 mg Intravenous Q4H PRN Bryan Pies, MD       pantoprazole (PROTONIX) injection 40 mg  40 mg Intravenous QHS Bryan Pies, MD   40 mg at 08/11/20 2105   promethazine (PHENERGAN) tablet 12.5-25 mg  12.5-25 mg Oral Q4H PRN Bryan Pies, MD        OBJECTIVE: white man examined in bed  Vitals:   08/12/20 1000 08/12/20 1100  BP: 120/76 118/77  Pulse: 69 (!) 59  Resp: 17 16  Temp:    SpO2: 94% 93%     Body mass index is 34.25 kg/m.   Wt Readings from Last 3 Encounters:  08/02/20 99.2 kg      ECOG FS:2 - Symptomatic, <50% confined to bed Head: Dressing clean and dry Ocular: Sclerae unicteric Lymphatic: No cervical or supraclavicular adenopathy, no axillary adenopathy Lungs no rales or rhonchi Heart regular rate and rhythm Abd soft, nontender, positive bowel sounds MSK no focal spinal tenderness Neuro: non-focal, well-oriented, appropriate affect; speech as described above, no motor deficits, sensation intact Skin: scars from prior melanoma resections noted; no suspicious skin lesions   LAB RESULTS:  CMP      Component Value Date/Time   NA 137 08/11/2020 0347   K 4.3 08/11/2020 0347   CL 103 08/11/2020 0347   CO2 24 08/11/2020 0347   GLUCOSE 115 (H) 08/11/2020 0347   BUN 37 (H) 08/11/2020 0347   CREATININE 1.54 (H) 08/11/2020 0347   CALCIUM 8.8 (L) 08/11/2020 0347   PROT 6.8 08/03/2020 0547   ALBUMIN 3.7 08/03/2020 0547   AST 19 08/03/2020 0547   ALT 21 08/03/2020 0547   ALKPHOS 37 (L) 08/03/2020 0547   BILITOT 0.7 08/03/2020 0547   GFRNONAA 49 (  L) 08/11/2020 0347    No results found for: TOTALPROTELP, ALBUMINELP, A1GS, A2GS, BETS, BETA2SER, GAMS, MSPIKE, SPEI  No results found for: KPAFRELGTCHN, LAMBDASER, KAPLAMBRATIO  Lab Results  Component Value Date   WBC 12.2 (H) 08/11/2020   NEUTROABS 7.1 08/03/2020   HGB 15.2 08/11/2020   HCT 46.2 08/11/2020   MCV 92.6 08/11/2020   PLT 234 08/11/2020    @LASTCHEMISTRY @  No results found for: LABCA2  No components found for: [id number]  No results for input(s): INR in the last 168 hours.  No results found for: LABCA2  No results found for: [id number]  No results found for: [id number]  No results found for: [id number]  No results found for: CA2729  No components found for: HGQUANT  No results found for: CEA1 / No results found for: CEA1   No results found for: AFPTUMOR  No results found for: CHROMOGRNA  No results found for: PSA1  No results displayed because visit has over 200 results.      (this displays the last labs from the last 3 days)  No results found for: TOTALPROTELP, ALBUMINELP, A1GS, A2GS, BETS, BETA2SER, GAMS, MSPIKE, SPEI (this displays SPEP labs)  No results found for: KPAFRELGTCHN, LAMBDASER, KAPLAMBRATIO (kappa/lambda light chains)  No results found for: HGBA, HGBA2QUANT, HGBFQUANT, HGBSQUAN (Hemoglobinopathy evaluation)   No results found for: LDH  No results found for: IRON, TIBC, IRONPCTSAT (Iron and TIBC)  No results found for: FERRITIN  Urinalysis No results found for: COLORURINE,  APPEARANCEUR, LABSPEC, PHURINE, GLUCOSEU, HGBUR, BILIRUBINUR, KETONESUR, PROTEINUR, UROBILINOGEN, NITRITE, LEUKOCYTESUR   STUDIES: CT Chest W Contrast  Result Date: 08/02/2020 CLINICAL DATA:  CNS neoplasm, staging evaluation EXAM: CT CHEST, ABDOMEN, AND PELVIS WITH CONTRAST TECHNIQUE: Multidetector CT imaging of the chest, abdomen and pelvis was performed following the standard protocol during bolus administration of intravenous contrast. CONTRAST:  6mL OMNIPAQUE IOHEXOL 300 MG/ML  SOLN COMPARISON:  None. FINDINGS: CT CHEST FINDINGS Cardiovascular: The heart is normal in size. No pericardial effusion. No evidence of thoracic aortic aneurysm. Coronary atherosclerosis of the LAD. Mediastinum/Nodes: No suspicious mediastinal lymphadenopathy. Visualized thyroid is unremarkable. Lungs/Pleura: Mild dependent atelectasis in the bilateral upper and lower lobes. No focal consolidation. No suspicious pulmonary nodules. No pleural effusion or pneumothorax. Musculoskeletal: Degenerative changes of the thoracic spine. CT ABDOMEN PELVIS FINDINGS Hepatobiliary: Liver is notable for hepatic steatosis with focal fatty sparing along the gallbladder fossa. Gallbladder is unremarkable. No intrahepatic or extrahepatic ductal dilatation. Pancreas: Within normal limits. Spleen: Within normal limits, noting a splenule in the splenic hilum (series 3/image 56). Adrenals/Urinary Tract: Adrenal glands are within normal limits. Bilateral renal cysts, measuring up to 5.5 cm in the anterior left upper kidney (series 3/image 62). 2 mm nonobstructing left lower pole renal calculus (series 3/image 69). No ureteral or bladder calculi. No hydronephrosis. Bladder is within normal limits. Stomach/Bowel: Stomach is within normal limits. No evidence of bowel obstruction. Prior appendectomy. Mild left colonic diverticulosis, without evidence of diverticulitis. Vascular/Lymphatic: No evidence of abdominal aortic aneurysm. Mild atherosclerotic  calcifications of the abdominal aorta. No suspicious abdominopelvic lymphadenopathy. Reproductive: Prostate is grossly unremarkable. Other: No abdominopelvic ascites. Tiny fat containing bilateral inguinal hernias (series 3/image 113). Musculoskeletal: Degenerative changes of the lumbar spine. IMPRESSION: No CT findings suspicious for primary malignancy in the chest, abdomen, or pelvis. Electronically Signed   By: Bryan Hobbs M.D.   On: 08/02/2020 17:04   CT LUMBAR SPINE W CONTRAST  Result Date: 08/07/2020 CLINICAL DATA:  Cauda equina syndrome. Brain mass is concerning for  metastatic melanoma. EXAM: CT LUMBAR SPINE WITH CONTRAST TECHNIQUE: Multidetector CT imaging of the lumbar spine was performed with intravenous contrast administration. CONTRAST:  184mL OMNIPAQUE IOHEXOL 300 MG/ML  SOLN COMPARISON:  CT chest, abdomen, and pelvis 08/02/2020 FINDINGS: Segmentation: Transitional lumbosacral anatomy with largely sacralized L5. Alignment: Trace anterolisthesis of L3 on L4. Vertebrae: No fracture or suspicious osseous lesion. Paraspinal and other soft tissues: Partially visualized bilateral renal cysts common small nonobstructing right renal calculus, and hepatic steatosis as seen on the recent prior CT. Mild abdominal aortic atherosclerosis without aneurysm. Disc levels: T10-11: Mild facet arthrosis without evidence of stenosis. T11-12 through L2-3: Negative. L3-4: Mild disc space narrowing. Circumferential disc bulging and moderate to severe facet and ligamentum flavum hypertrophy result in moderate to severe spinal stenosis and mild bilateral neural foraminal stenosis. L4-5: Moderate disc space narrowing with vacuum disc. Disc bulging, a calcified central disc protrusion, and moderate to severe right and mild-to-moderate left facet hypertrophy result in bilateral lateral recess stenosis and mild bilateral neural foraminal stenosis without significant generalized spinal stenosis. L5-S1: Transitional anatomy  with a hypoplastic disc.  No stenosis. IMPRESSION: 1. No evidence of acute osseous abnormality or metastatic disease in the lumbar spine. 2. Lumbar disc and facet degeneration most notable at L3-4 where there is moderate to severe spinal stenosis and mild bilateral neural foraminal stenosis. 3. Bilateral lateral recess stenosis and mild neural foraminal stenosis at L4-5. 4. Aortic Atherosclerosis (ICD10-I70.0). Electronically Signed   By: Bryan Hobbs M.D.   On: 08/07/2020 18:02   MR BRAIN W WO CONTRAST  Result Date: 08/04/2020 CLINICAL DATA:  Stroke follow-up. EXAM: MRI HEAD WITHOUT AND WITH CONTRAST TECHNIQUE: Multiplanar, multiecho pulse sequences of the brain and surrounding structures were obtained without and with intravenous contrast. CONTRAST:  85mL GADAVIST GADOBUTROL 1 MMOL/ML IV SOLN COMPARISON:  None. FINDINGS: Brain: Approximately 3.4 by 3.1 by 3.4 cm hemorrhagic mass in the posterior left frontal lobe with areas of internal T2 hypointensity, there a genius T1 hyperintensity and susceptibility artifact. There is irregular and wispy peripheral enhancement and restricted diffusion, compatible with hypercellularity. The mass abuts the overlying left frontal dura. dditional 1.5 by 0.6 by 0.9 cm enhancing mass within the right parietal lobe, which also demonstrates some faint peripheral restricted diffusion, compatible with hypercellularity. Both lesions demonstrate exuberant surrounding vasogenic edema. Resulting mass effect on the left lateral ventricle which is partially effaced. Additionally, there is approximately 3 mm of rightward midline shift at the foramen of Monro. Small focus of nonenhancing left frontal periventricular restricted diffusion (series 450, image 33). Additional scattered mild T2/FLAIR hyperintensities within the white matter, nonspecific but likely related to chronic microvascular ischemic disease. No hydrocephalus. Vascular: Major arterial flow voids are maintained at the skull  base. Skull and upper cervical spine: Normal marrow signal. Sinuses/Orbits: Mucosal thickening of ethmoid air cells, frontal sinuses and sphenoid sinuses. Unremarkable orbits. Other: Heterogeneous T1 hyperintensity involving the left nasopharynx. No mastoid effusions. IMPRESSION: 1. Large hemorrhagic and peripherally enhancing heterogeneous mass in the left posterior frontal lobe and additional enhancing mass in the right parietal lobe, as detailed above. Given multifocality, findings are concerning for metastatic disease. Multicentric high-grade glioma is a less likely differential consideration. 2. Exuberant surrounding edema. Resulting mass effect with partial left lateral ventricular effacement and 3 mm of rightward midline shift. 3. Small focus of nonenhancing left frontal periventricular restricted diffusion, which could represent acute infarct or an additional nonenhancing lesion. Recommend attention on close follow-up. 4. Heterogeneous T1 hyperintensity involving the left nasopharynx, which  is indeterminate but potentially represents proteinaceous retention cysts. This area is amenable to direct inspection. Electronically Signed   By: Bryan Harts MD   On: 08/04/2020 13:18   CT Abdomen Pelvis W Contrast  Result Date: 08/02/2020 CLINICAL DATA:  CNS neoplasm, staging evaluation EXAM: CT CHEST, ABDOMEN, AND PELVIS WITH CONTRAST TECHNIQUE: Multidetector CT imaging of the chest, abdomen and pelvis was performed following the standard protocol during bolus administration of intravenous contrast. CONTRAST:  21mL OMNIPAQUE IOHEXOL 300 MG/ML  SOLN COMPARISON:  None. FINDINGS: CT CHEST FINDINGS Cardiovascular: The heart is normal in size. No pericardial effusion. No evidence of thoracic aortic aneurysm. Coronary atherosclerosis of the LAD. Mediastinum/Nodes: No suspicious mediastinal lymphadenopathy. Visualized thyroid is unremarkable. Lungs/Pleura: Mild dependent atelectasis in the bilateral upper and lower  lobes. No focal consolidation. No suspicious pulmonary nodules. No pleural effusion or pneumothorax. Musculoskeletal: Degenerative changes of the thoracic spine. CT ABDOMEN PELVIS FINDINGS Hepatobiliary: Liver is notable for hepatic steatosis with focal fatty sparing along the gallbladder fossa. Gallbladder is unremarkable. No intrahepatic or extrahepatic ductal dilatation. Pancreas: Within normal limits. Spleen: Within normal limits, noting a splenule in the splenic hilum (series 3/image 56). Adrenals/Urinary Tract: Adrenal glands are within normal limits. Bilateral renal cysts, measuring up to 5.5 cm in the anterior left upper kidney (series 3/image 62). 2 mm nonobstructing left lower pole renal calculus (series 3/image 69). No ureteral or bladder calculi. No hydronephrosis. Bladder is within normal limits. Stomach/Bowel: Stomach is within normal limits. No evidence of bowel obstruction. Prior appendectomy. Mild left colonic diverticulosis, without evidence of diverticulitis. Vascular/Lymphatic: No evidence of abdominal aortic aneurysm. Mild atherosclerotic calcifications of the abdominal aorta. No suspicious abdominopelvic lymphadenopathy. Reproductive: Prostate is grossly unremarkable. Other: No abdominopelvic ascites. Tiny fat containing bilateral inguinal hernias (series 3/image 113). Musculoskeletal: Degenerative changes of the lumbar spine. IMPRESSION: No CT findings suspicious for primary malignancy in the chest, abdomen, or pelvis. Electronically Signed   By: Bryan Hobbs M.D.   On: 08/02/2020 17:04   MR PROSTATE W WO CONTRAST  Result Date: 08/04/2020 CLINICAL DATA:  Enlarged prostate with elevated PSA by report. EXAM: MR PROSTATE WITHOUT AND WITH CONTRAST TECHNIQUE: Multiplanar multisequence MRI images were obtained of the pelvis centered about the prostate. Pre and post contrast images were obtained. CONTRAST:  1mL GADAVIST GADOBUTROL 1 MMOL/ML IV SOLN COMPARISON:  None. FINDINGS: Prostate:  Transitional zone: (Image 15, series 18) homogeneous low T2 signal with lenticular appearance along the surgical capsule of the RIGHT posterolateral transitional zone near the base to mid of the RIGHT prostate gland showing very dark appearance on the ADC map and bright appearance on diffusion-weighted imaging PIRADS category 5 Signs of BPH elsewhere but with other areas of restricted diffusion most notably in the LEFT anterior mid gland. This does not correspond to a homogeneous area of low T2 signal and is therefore classified as PIRADS category 3 is that also is contained within a BPH nodule (image 13 of the ADC map). LEFT mid gland anterior transitional zone Peripheral zone: Linear and wedge-shaped areas of T2 hypointensity throughout the peripheral zone compatible with prior prostatitis. No signs of post biopsy hemorrhage. Volume: 51.3 cc Transcapsular spread:  Absent Seminal vesicle involvement: Absent Neurovascular bundle involvement: Absent Pelvic adenopathy: Absent Bone metastasis: Absent Other findings: None IMPRESSION: 1. PIRADS category 5 lesion in the RIGHT posterolateral transitional zone near the base to mid of the RIGHT prostate gland. Highly suspicious for prostate neoplasm. 2. PIRADS category 3 lesion in the LEFT anterior mid gland transitional  zone. 3. Findings of prior prostatitis. 4. Signs of BPH. Electronically Signed   By: Bryan Hobbs M.D.   On: 08/04/2020 15:33   EEG adult  Result Date: 08/03/2020 Bryan Havens, MD     08/03/2020  1:17 PM Patient Name: Bryan Hobbs MRN: [id number] Epilepsy Attending: Lora Hobbs Referring Physician/Provider: Dr Lesleigh Hobbs Date: 08/03/2020 Duration: 23 mins Patient history: 70 year old male with acute onset aphasia.  EEG to evaluate for seizures. Level of alertness: Awake, asleep AEDs during EEG study: Keppra Technical aspects: This EEG study was done with scalp electrodes positioned according to the 10-20 International system of electrode  placement. Electrical activity was acquired at a sampling rate of 500Hz  and reviewed with a high frequency filter of 70Hz  and a low frequency filter of 1Hz . EEG data were recorded continuously and digitally stored. Description: The posterior dominant rhythm consists of 9-10 Hz activity of moderate voltage (25-35 uV) seen predominantly in posterior head regions, symmetric and reactive to eye opening and eye closing. Sleep was characterized by vertex waves, sleep spindles (12 to 14 Hz), maximal frontocentral region. Hyperventilation and photic stimulation were not performed.   IMPRESSION: This study is within normal limits. No seizures or epileptiform discharges were seen throughout the recording. Bryan Hobbs   ECHOCARDIOGRAM COMPLETE  Result Date: 08/10/2020    ECHOCARDIOGRAM REPORT   Patient Name:   Bryan Hobbs Date of Exam: 08/10/2020 Medical Rec #:  [id number]       Height:       67.0 in Accession #:    [id number]      Weight:       218.7 lb Date of Birth:  14-Feb-1951       BSA:          2.101 m Patient Age:    30 years        BP:           141/86 mmHg Patient Gender: M               HR:           57 bpm. Exam Location:  Inpatient Procedure: 2D Echo Indications:    Pre-operative Cardiovascular Examination Z01.810  History:        Patient has no prior history of Echocardiogram examinations.                 Risk Factors:Hypertension.  Sonographer:    Mikki Santee RDCS (AE) Referring Phys: [id number] [place]  1. Left ventricular ejection fraction, by estimation, is 60 to 65%. The left ventricle has normal function. The left ventricle has no regional wall motion abnormalities. Left ventricular diastolic parameters were normal.  2. Right ventricular systolic function is normal. The right ventricular size is normal.  3. The mitral valve is normal in structure. No evidence of mitral valve regurgitation. No evidence of mitral stenosis.  4. The aortic valve is normal in structure. Aortic  valve regurgitation is trivial. No aortic stenosis is present.  5. The inferior vena cava is normal in size with greater than 50% respiratory variability, suggesting right atrial pressure of 3 mmHg. FINDINGS  Left Ventricle: Left ventricular ejection fraction, by estimation, is 60 to 65%. The left ventricle has normal function. The left ventricle has no regional wall motion abnormalities. The left ventricular internal cavity size was normal in size. There is  no left ventricular hypertrophy. Left ventricular diastolic parameters were normal. Normal left ventricular filling pressure. Right Ventricle: The right ventricular  size is normal. No increase in right ventricular wall thickness. Right ventricular systolic function is normal. Left Atrium: Left atrial size was normal in size. Right Atrium: Right atrial size was normal in size. Pericardium: There is no evidence of pericardial effusion. Mitral Valve: The mitral valve is normal in structure. No evidence of mitral valve regurgitation. No evidence of mitral valve stenosis. Tricuspid Valve: The tricuspid valve is normal in structure. Tricuspid valve regurgitation is not demonstrated. No evidence of tricuspid stenosis. Aortic Valve: The aortic valve is normal in structure. Aortic valve regurgitation is trivial. No aortic stenosis is present. Pulmonic Valve: The pulmonic valve was grossly normal. Pulmonic valve regurgitation is not visualized. No evidence of pulmonic stenosis. Aorta: The aortic root is normal in size and structure. Venous: The inferior vena cava is normal in size with greater than 50% respiratory variability, suggesting right atrial pressure of 3 mmHg. IAS/Shunts: No atrial level shunt detected by color flow Doppler.  LEFT VENTRICLE PLAX 2D LVIDd:         2.70 cm  Diastology LVIDs:         1.90 cm  LV e' medial:    7.29 cm/s LV PW:         0.90 cm  LV E/e' medial:  7.0 LV IVS:        1.10 cm  LV e' lateral:   11.60 cm/s LVOT diam:     2.10 cm  LV E/e'  lateral: 4.4 LV SV:         73 LV SV Index:   35 LVOT Area:     3.46 cm  RIGHT VENTRICLE RV S prime:     10.10 cm/s TAPSE (M-mode): 2.0 cm LEFT ATRIUM           Index       RIGHT ATRIUM          Index LA diam:      2.60 cm 1.24 cm/m  RA Area:     8.60 cm LA Vol (A4C): 22.9 ml 10.90 ml/m RA Volume:   13.80 ml 6.57 ml/m  AORTIC VALVE LVOT Vmax:   121.00 cm/s LVOT Vmean:  74.600 cm/s LVOT VTI:    0.210 m  AORTA Ao Root diam: 3.40 cm MITRAL VALVE MV Area (PHT): 2.91 cm    SHUNTS MV Decel Time: 261 msec    Systemic VTI:  0.21 m MV E velocity: 51.00 cm/s  Systemic Diam: 2.10 cm MV A velocity: 69.40 cm/s MV E/A ratio:  0.73 Bryan Croitoru MD Electronically signed by Bryan Klein MD Signature Date/Time: 08/10/2020/1:02:53 PM    Final    CT HEAD CODE STROKE WO CONTRAST  Result Date: 08/02/2020 CLINICAL DATA:  Code stroke. Neuro deficit. Acute stroke suspected. New onset of aphasia and left-sided facial droop beginning 2 hours ago. EXAM: CT HEAD WITHOUT CONTRAST TECHNIQUE: Contiguous axial images were obtained from the base of the skull through the vertex without intravenous contrast. COMPARISON:  None. FINDINGS: Brain: Mass lesion is present in the left posterior frontal and parietal lobe measuring 3.4 x 2.6 by 2.8 cm. Hyperdense material anteriorly is compatible with acute hemorrhage. A second lesion is present in the posterior right parietal lobe measuring up to 1.4 cm. Vasogenic edema surrounds both lesions. There is some local mass effect in the posterior left frontal lobe and parietal lobe with effacement of the sulci. No significant midline shift is present. The basal ganglia are intact. No acute or focal cortical abnormalities are present. White matter  is otherwise unremarkable. Vascular: No hyperdense vessel or unexpected calcification. Skull: Calvarium is intact. No focal lytic or blastic lesions are present. No significant extracranial soft tissue lesion is present. Sinuses/Orbits: Polyp or mucous  retention cyst occludes the right ostiomeatal complex. Scattered opacification of ethmoid air cells is noted bilaterally. The globes and orbits are within normal limits. IMPRESSION: 1. At least 2 mass lesions identified. The largest is in the posterior left frontal lobe and parietal lobe. Acute hemorrhage is noted in the anterior aspect of this lesion. 2. Second lesion identified within the right parietal lobe. 3. These likely reflect metastatic lesions. 4. No additional acute hemorrhage or focal cortical infarct. Critical Value/emergent results were called by telephone at the time of interpretation on 08/02/2020 at 12:25 pm to provider Dr. Curly Shores, who verbally acknowledged these results. Electronically Signed   By: San Morelle M.D.   On: 08/02/2020 12:29    ELIGIBLE FOR AVAILABLE RESEARCH PROTOCOL:   ASSESSMENT: 70 y.o. [place] man presenting with aphasia and found to have 2 brain lesions, one hemorrhagic, in the setting of a prior history of cutaneous melanoma; also with a very elevated PSA and MRI evidence of prostate cancer.  PLAN: Judea went to the OR on 4/27 for left craniotomy with resection of his tumor. Final pathology is pending.  Treatment recommendations pending results of the biopsy.  Additionally, the patient has prostate cancer which is likely localized.  For now, we are holding on further work-up and treatment to attend to his brain lesions.  He will follow-up for his prostate cancer once he gets back home.   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  That pathology including glioma grade and other studies will not be finalized for at least another week.  I had hoped to see Mr. Curlene Labrum this evening to give him this information but he is currently having  an MRI and I will be out of town for the next 3days.  In any case it would be reasonable for Mr. Curlene Labrum to return to his home in the [place] and reestablish contact with his oncologist there  In a few days we will have the final report and they will be able to advise him regarding definitive treatment.  His likely prostate cancer also can be worked up and treated there.  There is no urgency to start treatment for his prostate cancer at this point.   Chauncey Cruel, MD Medical Oncology and Hematology [facility] [address] [place], [place] [address] Tel. [phone]    Fax. [phone]

## 2020-08-12 NOTE — Transfer of Care (Signed)
Immediate Anesthesia Transfer of Care Note  Patient: Bryan Hobbs  Procedure(s) Performed: MRI WITH ANESTHESIA (N/A )  Patient Location: PACU  Anesthesia Type:General  Level of Consciousness: awake, alert , oriented and patient cooperative  Airway & Oxygen Therapy: Patient Spontanous Breathing  Post-op Assessment: Report given to RN and Post -op Vital signs reviewed and stable  Post vital signs: Reviewed and stable  Last Vitals:  Vitals Value Taken Time  BP 129/84 08/12/20 1733  Temp    Pulse 54 08/12/20 1738  Resp 18 08/12/20 1738  SpO2 95 % 08/12/20 1738  Vitals shown include unvalidated device data.  Last Pain:  Vitals:   08/12/20 0800  TempSrc: Oral  PainSc: 0-No pain         Complications: No complications documented.

## 2020-08-12 NOTE — Progress Notes (Signed)
Spoke with MRI. They will coordinate with CRNA and follow up.

## 2020-08-12 NOTE — Progress Notes (Signed)
Subjective: The patient is alert and pleasant.  He looks well.  His partner is at the bedside.  Objective: Vital signs in last 24 hours: Temp:  [97.6 F (36.4 C)-98.2 F (36.8 C)] 97.7 F (36.5 C) (04/28 0400) Pulse Rate:  [51-63] 55 (04/28 0700) Resp:  [12-26] 18 (04/28 0700) BP: (104-150)/(66-101) 111/94 (04/28 0700) SpO2:  [91 %-99 %] 94 % (04/28 0700) Estimated body mass index is 34.25 kg/m as calculated from the following:   Height as of this encounter: 5\' 7"  (1.702 m).   Weight as of this encounter: 99.2 kg.   Intake/Output from previous day: 04/27 0701 - 04/28 0700 In: 3065.2 [I.V.:2264.7; IV Piggyback:800.4] Out: 2700 [Urine:2700] Intake/Output this shift: No intake/output data recorded.  Physical exam the patient is alert and oriented.  His speech is normal.  His strength is normal.  His dressing is clean and dry.  Lab Results: Recent Labs    08/10/20 0700 08/11/20 0347  WBC 12.1* 12.2*  HGB 16.4 15.2  HCT 49.0 46.2  PLT 231 234   BMET Recent Labs    08/10/20 0700 08/11/20 0347  NA 136 137  K 4.9 4.3  CL 103 103  CO2 23 24  GLUCOSE 108* 115*  BUN 35* 37*  CREATININE 1.52* 1.54*  CALCIUM 8.9 8.8*    Studies/Results: ECHOCARDIOGRAM COMPLETE  Result Date: 08/10/2020    ECHOCARDIOGRAM REPORT   Patient Name:   Bryan Hobbs Date of Exam: 08/10/2020 Medical Rec #:  [id number]       Height:       67.0 in Accession #:    [id number]      Weight:       218.7 lb Date of Birth:  08-Oct-1950       BSA:          2.101 m Patient Age:    70 years        BP:           141/86 mmHg Patient Gender: M               HR:           57 bpm. Exam Location:  Inpatient Procedure: 2D Echo Indications:    Pre-operative Cardiovascular Examination Z01.810  History:        Patient has no prior history of Echocardiogram examinations.                 Risk Factors:Hypertension.  Sonographer:    Mikki Santee RDCS (AE) Referring Phys: [id number] [place]  1. Left  ventricular ejection fraction, by estimation, is 60 to 65%. The left ventricle has normal function. The left ventricle has no regional wall motion abnormalities. Left ventricular diastolic parameters were normal.  2. Right ventricular systolic function is normal. The right ventricular size is normal.  3. The mitral valve is normal in structure. No evidence of mitral valve regurgitation. No evidence of mitral stenosis.  4. The aortic valve is normal in structure. Aortic valve regurgitation is trivial. No aortic stenosis is present.  5. The inferior vena cava is normal in size with greater than 50% respiratory variability, suggesting right atrial pressure of 3 mmHg. FINDINGS  Left Ventricle: Left ventricular ejection fraction, by estimation, is 60 to 65%. The left ventricle has normal function. The left ventricle has no regional wall motion abnormalities. The left ventricular internal cavity size was normal in size. There is  no left ventricular hypertrophy. Left ventricular diastolic parameters were normal. Normal left  ventricular filling pressure. Right Ventricle: The right ventricular size is normal. No increase in right ventricular wall thickness. Right ventricular systolic function is normal. Left Atrium: Left atrial size was normal in size. Right Atrium: Right atrial size was normal in size. Pericardium: There is no evidence of pericardial effusion. Mitral Valve: The mitral valve is normal in structure. No evidence of mitral valve regurgitation. No evidence of mitral valve stenosis. Tricuspid Valve: The tricuspid valve is normal in structure. Tricuspid valve regurgitation is not demonstrated. No evidence of tricuspid stenosis. Aortic Valve: The aortic valve is normal in structure. Aortic valve regurgitation is trivial. No aortic stenosis is present. Pulmonic Valve: The pulmonic valve was grossly normal. Pulmonic valve regurgitation is not visualized. No evidence of pulmonic stenosis. Aorta: The aortic root is  normal in size and structure. Venous: The inferior vena cava is normal in size with greater than 50% respiratory variability, suggesting right atrial pressure of 3 mmHg. IAS/Shunts: No atrial level shunt detected by color flow Doppler.  LEFT VENTRICLE PLAX 2D LVIDd:         2.70 cm  Diastology LVIDs:         1.90 cm  LV e' medial:    7.29 cm/s LV PW:         0.90 cm  LV E/e' medial:  7.0 LV IVS:        1.10 cm  LV e' lateral:   11.60 cm/s LVOT diam:     2.10 cm  LV E/e' lateral: 4.4 LV SV:         73 LV SV Index:   35 LVOT Area:     3.46 cm  RIGHT VENTRICLE RV S prime:     10.10 cm/s TAPSE (M-mode): 2.0 cm LEFT ATRIUM           Index       RIGHT ATRIUM          Index LA diam:      2.60 cm 1.24 cm/m  RA Area:     8.60 cm LA Vol (A4C): 22.9 ml 10.90 ml/m RA Volume:   13.80 ml 6.57 ml/m  AORTIC VALVE LVOT Vmax:   121.00 cm/s LVOT Vmean:  74.600 cm/s LVOT VTI:    0.210 m  AORTA Ao Root diam: 3.40 cm MITRAL VALVE MV Area (PHT): 2.91 cm    SHUNTS MV Decel Time: 261 msec    Systemic VTI:  0.21 m MV E velocity: 51.00 cm/s  Systemic Diam: 2.10 cm MV A velocity: 69.40 cm/s MV E/A ratio:  0.73 Mihai Croitoru MD Electronically signed by Sanda Klein MD Signature Date/Time: 08/10/2020/1:02:53 PM    Final     Assessment/Plan: Postop day #1: The patient is doing well.  We are awaiting the pathology on this tumor.  We discussed a postoperative MRI to judge the extent of resection.  Unfortunately the patient is extremely claustrophobic and will require general anesthesia.  LOS: 9 days     Ophelia Charter 08/12/2020, 8:03 AM

## 2020-08-12 NOTE — Progress Notes (Deleted)
Patient refused morning labs.  Patient educated about the importance of lab work.  Will continue to monitor.

## 2020-08-12 NOTE — Discharge Summary (Addendum)
Name: Bryan Hobbs MRN: [id number] DOB: 1950/11/08 70 y.o. PCP: Laurence Ferrari  Date of Admission: 08/02/2020 11:54 AM Date of Discharge:  08/14/20 Attending Physician: Lalla Brothers, MD  Discharge Diagnosis: 1. 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 Prostate cancer 3. Obstructive sleep apnea  Discharge Medications: Allergies as of 08/14/2020      Reactions   Peanut-containing Drug Products Swelling   Shellfish Allergy Swelling   Penicillins    He was told by a parent that he had an allergy as a child      Medication List    TAKE these medications   dexamethasone 2 MG tablet Commonly known as: DECADRON Take 1 tablet (2 mg total) by mouth 3 (three) times daily for 21 days.   fluorouracil 5 % cream Commonly known as: EFUDEX Apply 1 application topically 2 (two) times daily. scalp   levETIRAcetam 500 MG tablet Commonly known as: KEPPRA Take 1 tablet (500 mg total) by mouth 2 (two) times daily for 21 days.   OVER THE COUNTER MEDICATION Take 4 tablets by mouth daily. Juice plus- 4 different kinds            Durable Medical Equipment  (From admission, onward)         Start     Ordered   08/13/20 1610  For home use only DME Walker rolling  Once       Question Answer Comment  Walker: With [place]   Patient needs a walker to treat with the following condition Brain tumor ([place])      08/13/20 1610          Disposition and follow-up:   Mr.Bryan Hobbs was discharged from [facility] in Stable condition.  At the hospital follow up visit please address:  1.  Follow up: . Brain masses, suspect glioma:  o 2 masses on brain imaging, craniotomy with resection of left tumor on 4/27, pathology pending but limited to the suspected glioma, final report likely no sooner than 5/6.  o Notably there is a second tumor which was not removed or biopsied, pending biopsy results for further decisions on this.   o Discharged on Decadron 2 mg 3 times daily and  Keppra for seizure prophylaxis.  No seizure this admission.   o At this time has mild aphasia and RLE weakness.  o To follow-up pathology results and further treatment with oncology at [facility] in [place]. . Prostate cancer: Prostate MRI this admission with PI-RADS category 5, presented with urology who believes not related to current presentation, follow-up for biopsy and further management outpatient . Obstructive sleep apnea: No history of such but cannot tolerate CPAP mask due to severe claustrophobia, noted bradycardia this admission primarily overnight likely related to this  2.  Labs / imaging needed at time of follow-up: BMP to follow CKD  3.  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   CT scan demonstrated 3.4 x 2.6 x 2.8 cm lesion in the left posterior frontal and parietal lobe with acute hemorrhage, and another 1.4cm lesion in the posterior right parietal lobe.  He had a recent CT head in November 2021 after a fall which did not demonstrate these lesions.  No known active cancer, but has a history of melanoma x3 last excision in 2017 so suspect that this to be his  source.  Also noted multiple skin lesions and prior basal cell carcinoma as well, he has good dermatology follow-up.  MRI was obtained under general anesthesia due to severe claustrophobia.  This was again suspicious for malignancy.  He was scheduled for craniotomy and resection.  While waiting for this, had mild fluctuations in neuro status.  Notably had moderate expressive aphasia at presentation which improved with Decadron.  Also had right upper extremity weakness, left upper extremity weakness.  Had 1 report of fecal incontinence while urinating but not more after that, he suspects he did not notice it due to small size of stool.  CT  lumbar was not concerning for malignancy.  Deferred lumbar MRI due to severe claustrophobia and resolving symptoms.  Patient had resection of the tumor on 4/27, during which gross reseection of the entire mass was achieved.  Pathology still pending at this time, but preliminarily appears to be some type of glioma.  Sent to [facility] for further studies, expect results back on 5/6 at the soonest.  After surgery, patient's neurologic symptoms are improved.  His expressive aphasia is now minimal, and right arm resolved by postop day 2.  He is discharged with Decadron 2 mg 3 times daily for cerebral edema, and Keppra for seizure prophylaxis (no seizures this admission).  Patient's PCP has referred him to a neurosurgeon in [place] at [facility] and has appointment scheduled on 5/11. I called [facility] which is his preferred provider for his cancer care, they have called the patient and will verify his insurance and schedule an appointment. He should be in network. They said they can see him within 2 weeks, his pathology is not expected to be back for another week so the timing is good.  # High suspicion for prostate cancer Patient with history of elevated PSA last of 7.1 in February and had plan for prostate biopsy in [place].  Repeat PSA here was even higher at 18.9.  MRI demonstrated PI-RADS category 5 lesion.  Discussed with urology who do not believe this is related to his presenting brain mass.  Follow-up outpatient for biopsy.  # Sleep apnea Patient has long known history of sleep apnea but cannot tolerate CPAP mask due to severe claustrophobia. No significant daytime drowsiness, no desaturations. Has nighttime bradycardia into 50s.  Subjective on day of discharge: Patient states he feels well, no new changes from yesterday. Speech somewhat worse than yesterday but could be because he is just waking up. No pain anywhere. Comfortable with plan to discharge today and return to [place]. Hoping for a  flight tonight, but if unable to leave until tomorrow they prefer to stay in a hotel and avoid another day's hospital bills.  Discharge Exam:   BP (!) 128/94 (BP Location: Left Arm)   Pulse 64   Temp (!) 97.5 F (36.4 C) (Oral)   Resp 18   Ht 5\' 7"  (1.702 m)   Wt 99.2 kg   SpO2 92%   BMI 34.25 kg/m  Discharge exam: Physical Exam Vitals and nursing note reviewed.  Constitutional:      General: He is not in acute distress.    Appearance: Normal appearance.  HENT:     Head: Normocephalic and atraumatic.     Right Ear: External ear normal.     Left Ear: External ear normal.     Nose: Nose normal.     Mouth/Throat:     Mouth: Mucous membranes are dry.  Eyes:  Extraocular Movements: Extraocular movements intact.     Pupils: Pupils are equal, round, and reactive to light.  Pulmonary:     Effort: Pulmonary effort is normal.  Abdominal:     General: Abdomen is flat.  Musculoskeletal:        General: No tenderness or signs of injury.     Cervical back: Normal range of motion.  Skin:    General: Skin is warm and dry.  Neurological:     Mental Status: He is alert.     Comments: Mild expressive aphasia 4/5 RLE strength Gait is mildly impaired due to slight RLE weakness but ambulating steadily without assistance  Psychiatric:        Mood and Affect: Mood normal.        Behavior: Behavior normal.     Pertinent Labs, Studies, and Procedures:  CT Chest W Contrast  Result Date: 08/02/2020 CLINICAL DATA:  CNS neoplasm, staging evaluation EXAM: CT CHEST, ABDOMEN, AND PELVIS WITH CONTRAST TECHNIQUE: Multidetector CT imaging of the chest, abdomen and pelvis was performed following the standard protocol during bolus administration of intravenous contrast. CONTRAST:  60mL OMNIPAQUE IOHEXOL 300 MG/ML  SOLN COMPARISON:  None. FINDINGS: CT CHEST FINDINGS Cardiovascular: The heart is normal in size. No pericardial effusion. No evidence of thoracic aortic aneurysm. Coronary atherosclerosis  of the LAD. Mediastinum/Nodes: No suspicious mediastinal lymphadenopathy. Visualized thyroid is unremarkable. Lungs/Pleura: Mild dependent atelectasis in the bilateral upper and lower lobes. No focal consolidation. No suspicious pulmonary nodules. No pleural effusion or pneumothorax. Musculoskeletal: Degenerative changes of the thoracic spine. CT ABDOMEN PELVIS FINDINGS Hepatobiliary: Liver is notable for hepatic steatosis with focal fatty sparing along the gallbladder fossa. Gallbladder is unremarkable. No intrahepatic or extrahepatic ductal dilatation. Pancreas: Within normal limits. Spleen: Within normal limits, noting a splenule in the splenic hilum (series 3/image 56). Adrenals/Urinary Tract: Adrenal glands are within normal limits. Bilateral renal cysts, measuring up to 5.5 cm in the anterior left upper kidney (series 3/image 62). 2 mm nonobstructing left lower pole renal calculus (series 3/image 69). No ureteral or bladder calculi. No hydronephrosis. Bladder is within normal limits. Stomach/Bowel: Stomach is within normal limits. No evidence of bowel obstruction. Prior appendectomy. Mild left colonic diverticulosis, without evidence of diverticulitis. Vascular/Lymphatic: No evidence of abdominal aortic aneurysm. Mild atherosclerotic calcifications of the abdominal aorta. No suspicious abdominopelvic lymphadenopathy. Reproductive: Prostate is grossly unremarkable. Other: No abdominopelvic ascites. Tiny fat containing bilateral inguinal hernias (series 3/image 113). Musculoskeletal: Degenerative changes of the lumbar spine. IMPRESSION: No CT findings suspicious for primary malignancy in the chest, abdomen, or pelvis. Electronically Signed   By: Julian Hy M.D.   On: 08/02/2020 17:04   CT Abdomen Pelvis W Contrast  Result Date: 08/02/2020 CLINICAL DATA:  CNS neoplasm, staging evaluation EXAM: CT CHEST, ABDOMEN, AND PELVIS WITH CONTRAST TECHNIQUE: Multidetector CT imaging of the chest, abdomen and  pelvis was performed following the standard protocol during bolus administration of intravenous contrast. CONTRAST:  77mL OMNIPAQUE IOHEXOL 300 MG/ML  SOLN COMPARISON:  None. FINDINGS: CT CHEST FINDINGS Cardiovascular: The heart is normal in size. No pericardial effusion. No evidence of thoracic aortic aneurysm. Coronary atherosclerosis of the LAD. Mediastinum/Nodes: No suspicious mediastinal lymphadenopathy. Visualized thyroid is unremarkable. Lungs/Pleura: Mild dependent atelectasis in the bilateral upper and lower lobes. No focal consolidation. No suspicious pulmonary nodules. No pleural effusion or pneumothorax. Musculoskeletal: Degenerative changes of the thoracic spine. CT ABDOMEN PELVIS FINDINGS Hepatobiliary: Liver is notable for hepatic steatosis with focal fatty sparing along the gallbladder fossa. Gallbladder is  unremarkable. No intrahepatic or extrahepatic ductal dilatation. Pancreas: Within normal limits. Spleen: Within normal limits, noting a splenule in the splenic hilum (series 3/image 56). Adrenals/Urinary Tract: Adrenal glands are within normal limits. Bilateral renal cysts, measuring up to 5.5 cm in the anterior left upper kidney (series 3/image 62). 2 mm nonobstructing left lower pole renal calculus (series 3/image 69). No ureteral or bladder calculi. No hydronephrosis. Bladder is within normal limits. Stomach/Bowel: Stomach is within normal limits. No evidence of bowel obstruction. Prior appendectomy. Mild left colonic diverticulosis, without evidence of diverticulitis. Vascular/Lymphatic: No evidence of abdominal aortic aneurysm. Mild atherosclerotic calcifications of the abdominal aorta. No suspicious abdominopelvic lymphadenopathy. Reproductive: Prostate is grossly unremarkable. Other: No abdominopelvic ascites. Tiny fat containing bilateral inguinal hernias (series 3/image 113). Musculoskeletal: Degenerative changes of the lumbar spine. IMPRESSION: No CT findings suspicious for primary  malignancy in the chest, abdomen, or pelvis. Electronically Signed   By: Julian Hy M.D.   On: 08/02/2020 17:04   EEG adult  Result Date: 08/03/2020 Lora Havens, MD     08/03/2020  1:17 PM Patient Name: Bryan Hobbs MRN: [id number] Epilepsy Attending: Lora Havens Referring Physician/Provider: Dr Lesleigh Noe Date: 08/03/2020 Duration: 23 mins Patient history: 70 year old male with acute onset aphasia.  EEG to evaluate for seizures. Level of alertness: Awake, asleep AEDs during EEG study: Keppra Technical aspects: This EEG study was done with scalp electrodes positioned according to the 10-20 International system of electrode placement. Electrical activity was acquired at a sampling rate of 500Hz  and reviewed with a high frequency filter of 70Hz  and a low frequency filter of 1Hz . EEG data were recorded continuously and digitally stored. Description: The posterior dominant rhythm consists of 9-10 Hz activity of moderate voltage (25-35 uV) seen predominantly in posterior head regions, symmetric and reactive to eye opening and eye closing. Sleep was characterized by vertex waves, sleep spindles (12 to 14 Hz), maximal frontocentral region. Hyperventilation and photic stimulation were not performed.   IMPRESSION: This study is within normal limits. No seizures or epileptiform discharges were seen throughout the recording. Lora Havens   CT HEAD CODE STROKE WO CONTRAST  Result Date: 08/02/2020 CLINICAL DATA:  Code stroke. Neuro deficit. Acute stroke suspected. New onset of aphasia and left-sided facial droop beginning 2 hours ago. EXAM: CT HEAD WITHOUT CONTRAST TECHNIQUE: Contiguous axial images were obtained from the base of the skull through the vertex without intravenous contrast. COMPARISON:  None. FINDINGS: Brain: Mass lesion is present in the left posterior frontal and parietal lobe measuring 3.4 x 2.6 by 2.8 cm. Hyperdense material anteriorly is compatible with acute hemorrhage. A  second lesion is present in the posterior right parietal lobe measuring up to 1.4 cm. Vasogenic edema surrounds both lesions. There is some local mass effect in the posterior left frontal lobe and parietal lobe with effacement of the sulci. No significant midline shift is present. The basal ganglia are intact. No acute or focal cortical abnormalities are present. White matter is otherwise unremarkable. Vascular: No hyperdense vessel or unexpected calcification. Skull: Calvarium is intact. No focal lytic or blastic lesions are present. No significant extracranial soft tissue lesion is present. Sinuses/Orbits: Polyp or mucous retention cyst occludes the right ostiomeatal complex. Scattered opacification of ethmoid air cells is noted bilaterally. The globes and orbits are within normal limits. IMPRESSION: 1. At least 2 mass lesions identified. The largest is in the posterior left frontal lobe and parietal lobe. Acute hemorrhage is noted in the anterior aspect of this lesion.  2. Second lesion identified within the right parietal lobe. 3. These likely reflect metastatic lesions. 4. No additional acute hemorrhage or focal cortical infarct. Critical Value/emergent results were called by telephone at the time of interpretation on 08/02/2020 at 12:25 pm to provider Dr. Curly Shores, who verbally acknowledged these results. Electronically Signed   By: San Morelle M.D.   On: 08/02/2020 12:29     Discharge Instructions: Discharge Instructions    Ambulatory referral to Occupational Therapy   Complete by: As directed    Ambulatory referral to Physical Therapy   Complete by: As directed    Ambulatory referral to Speech Therapy   Complete by: As directed       Signed: [name], MD 08/14/2020, 11:20 AM   Pager: [phone]

## 2020-08-12 NOTE — Progress Notes (Signed)
SLP Cancellation Note  Patient Details Name: Bryan Hobbs MRN: [id number] DOB: 1950-10-25   Cancelled treatment:       Reason Eval/Treat Not Completed: Other (comment). Unable to reassess swallow function s/p resection due to pt is NPO for procedure later today. RN reports no difficulty with breakfast this morning. Will continue efforts.  Page Lancon B. Quentin Ore, [facility], [place] Speech Language Pathologist Office: [phone]  Shonna Chock 08/12/2020, 1:15 PM

## 2020-08-12 NOTE — Anesthesia Procedure Notes (Signed)
Procedure Name: LMA Insertion Date/Time: 08/12/2020 4:30 PM Performed by: Oletta Lamas, CRNA Pre-anesthesia Checklist: Patient identified, Emergency Drugs available, Suction available and Patient being monitored Patient Re-evaluated:Patient Re-evaluated prior to induction Oxygen Delivery Method: Circle System Utilized Preoxygenation: Pre-oxygenation with 100% oxygen Induction Type: IV induction Ventilation: Mask ventilation without difficulty LMA: LMA inserted LMA Size: 4.0 Number of attempts: 1 Placement Confirmation: positive ETCO2 Tube secured with: Tape Dental Injury: Teeth and Oropharynx as per pre-operative assessment

## 2020-08-13 ENCOUNTER — Encounter (HOSPITAL_COMMUNITY): Payer: Self-pay | Admitting: Radiology

## 2020-08-13 DIAGNOSIS — N4289 Other specified disorders of prostate: Secondary | ICD-10-CM | POA: Diagnosis not present

## 2020-08-13 DIAGNOSIS — G939 Disorder of brain, unspecified: Secondary | ICD-10-CM | POA: Diagnosis not present

## 2020-08-13 MED ORDER — PANTOPRAZOLE SODIUM 20 MG PO TBEC
20.0000 mg | DELAYED_RELEASE_TABLET | Freq: Two times a day (BID) | ORAL | Status: DC
  Administered 2020-08-13 – 2020-08-14 (×2): 20 mg via ORAL
  Filled 2020-08-13 (×2): qty 1

## 2020-08-13 MED ORDER — PANTOPRAZOLE SODIUM 40 MG PO TBEC: 40.0000 mg | DELAYED_RELEASE_TABLET | Freq: Every day | ORAL | Status: DC

## 2020-08-13 MED ORDER — LEVETIRACETAM 500 MG PO TABS
500.0000 mg | ORAL_TABLET | Freq: Two times a day (BID) | ORAL | Status: DC
  Administered 2020-08-13 – 2020-08-14 (×2): 500 mg via ORAL
  Filled 2020-08-13 (×2): qty 1

## 2020-08-13 NOTE — Progress Notes (Addendum)
Subjective:   No acute events overnight, No new symptoms today. States that his speech seems improved. Has been walking to the bathroom and states his gait feels normal. R arm strength improved as well.  We discussed where would be the best place to continue his care.  As he is going to be waiting on pathology results for at least 1 week, this would be a good window for him to return to [place] for his continued care.  Oncology suggested this in their note from yesterday.  I also spoke with his neurosurgeon who agrees with this.  The patient understands and has been very grateful for the care he has received here.  He is agreeable to discharging soon from this hospital and resuming his care in [place].  He has consult to a neurosurgeon already placed through his PCP.  I have called [facility] which is within his insurance plan, and they will call him to make an appointment within 2 weeks.  I expect his pathology to be back at the earliest on 08/20/2020.  We will send patient with discs of his imaging studies done here.  Objective:  Vital signs in last 24 hours: Vitals:   08/13/20 0500 08/13/20 0600 08/13/20 0609 08/13/20 0700  BP: 103/69  106/73 114/76  Pulse: (!) 58 (!) 53 (!) 51 (!) 52  Resp: 17 15 14 15   Temp:      TempSrc:      SpO2: 93% 93% 93% 94%  Weight:      Height:        Physical Exam Constitutional: no acute distress Head: atraumatic ENT: external ears normal, craniotomy scar over L scalp without active bleeding Eyes: EOMI Pulmonary: effort normal Abdominal: flat Skin: warm and dry, numerous diffuse nevi, actinic keratosis, seborrheic keratosis, and hemangiomas Neurological: alert.  Speech is minimally aphasic.  Unable to detect any right upper extremity weakness at this time. Psychiatric: normal mood and affect  Assessment/Plan: Bryan Hobbs is a 70 y.o. male with hx of hypertension and melanoma presenting for acute onset aphasia, CT head concerning  for 2 new masses with hemorrhage concerning for metastatic melanoma. Prostate MRI also highly suspicious for cancer but unlikely to be the primary with no other mets on pan scan.   Active Problems:   Brain lesion   Aphasia   Elevated PSA   Prostate mass   Aortic atherosclerosis (HCC)   Brain metastases (HCC)   Brain tumor (HCC)  Brain mass x2 with hemorrhage, concern for  glioma, s/p craniotomy and resection POD 2.  Repeat MRI after the surgery demonstrate expected postsurgical changes, and relatively stable appearance of the right parietal lobe lesion. No know immunocompromising conditions, HIV negative.  Medical oncology discussed with pathology who believes that melanoma is unlikely given the appearance, sample sent to [facility] for further studies and expect this to be back on 5/6 at the earliest. Had a long discussion today with patient concerning coordination of his care.  We discussed that at this time, they are waiting for 1 week for the pathology results visit become pertaining to go back to [place].  Medical oncology and neurosurgery are in agreement.  Patient and his partner agree as well.  Have called [facility] who will make appointment within 2 weeks, he already has neurosurgery referral from his PCP in [place].  We will coordinate care further and plan for discharge soon to resume care in [place].  - neurosurgery, and oncology following - Follow pathology results, expect  back by 5/6 at the earliest - Continue Decadron for cerebral edema.  Per neurosurgery, can discharged with 2 mg 3 times daily - Keppra and Ativan per neurology - Neuro checks    Sleep apnea Patient has long known history of sleep apnea but cannot tolerate CPAP mask due to severe claustrophobia. No significant daytime drowsiness. Has nighttime bradycardia into 50s. - continue telemetry  Cervical and Lumbar Spinal Stenosis  Patient noted a stool which he did not feel passing, but this has not since recurred. States  this may have been due to small size of the stool. - monitor symptoms  Prostate cancer, high suspicion MRI prostate with PIRADS category V lesion. PSA 7.1 in February, 18.9 this admission. Spoke with urology who do not believe this is related as his pan scan did not show any mets, recommend outpatient biopsy. - follow up outpatient for prostate biopsy, scheduled on 5/20  CKD stage IIIa Creatinine 1.5 on admission, which seems close to his baseline. - Avoid nephrotoxins - Follow-up microalbumin creatinine ratio   Diet: heart healthy IVF:  none VTE:  SCD Prior to Admission Living Arrangement:  home Anticipated Discharge Location:  home Barriers to Discharge: Coordination of medical care, IV medication Dispo: Discharge in 1 to 2 days  Dr. Edison Simon Internal Medicine PGY-1  Pager: [phone] After 5pm on weekdays and 1pm on weekends: On Call pager [phone]  08/13/2020, 7:21 AM

## 2020-08-13 NOTE — Progress Notes (Signed)
Subjective: The patient is alert and pleasant.  He looks and feels well.  He is working on his computer.  His partner is at the bedside.  Objective: Vital signs in last 24 hours: Temp:  [97.8 F (36.6 C)] 97.8 F (36.6 C) (04/28 2000) Pulse Rate:  [48-71] 51 (04/29 0609) Resp:  [13-19] 14 (04/29 0609) BP: (97-129)/(67-96) 106/73 (04/29 0609) SpO2:  [93 %-97 %] 93 % (04/29 0609) Estimated body mass index is 34.25 kg/m as calculated from the following:   Height as of this encounter: 5\' 7"  (1.702 m).   Weight as of this encounter: 99.2 kg.   Intake/Output from previous day: 04/28 0701 - 04/29 0700 In: 1621.4 [P.O.:480; I.V.:914.2; IV Piggyback:227.2] Out: 500 [Urine:500] Intake/Output this shift: Total I/O In: 1030.1 [P.O.:480; I.V.:450; IV Piggyback:100.1] Out: -   Physical exam the patient is alert and oriented.  His speech and strength are normal.  His dressing is clean and dry.  Lab Results: Recent Labs    08/10/20 0700 08/11/20 0347  WBC 12.1* 12.2*  HGB 16.4 15.2  HCT 49.0 46.2  PLT 231 234   BMET Recent Labs    08/10/20 0700 08/11/20 0347  NA 136 137  K 4.9 4.3  CL 103 103  CO2 23 24  GLUCOSE 108* 115*  BUN 35* 37*  CREATININE 1.52* 1.54*  CALCIUM 8.9 8.8*    Studies/Results: MR BRAIN W WO CONTRAST  Result Date: 08/12/2020 CLINICAL DATA:  Intracranial mass post resection EXAM: MRI HEAD WITHOUT AND WITH CONTRAST TECHNIQUE: Multiplanar, multiecho pulse sequences of the brain and surrounding structures were obtained without and with intravenous contrast. CONTRAST:  43mL GADAVIST GADOBUTROL 1 MMOL/ML IV SOLN COMPARISON:  Preoperative MRI 08/05/2018 FINDINGS: Brain: New postoperative changes are present. A left lateral frontoparietal resection cavity is present containing fluid and blood products. There is reduced diffusion at the surgical cavity margins, some of which is artifactual due to susceptibility. Additional focus is present deep to this region along  the margin of the left lateral ventricle but was also present preoperatively and may reflect an infarct. T1 shortening is already present at the surgical cavity margins somewhat limiting evaluation for enhancement. Greatest enhancement is present at the superior and posterior margins. Degree of surrounding edema and regional mass effect without substantial change. Stable two small adjacent enhancing lesions of the parasagittal right parietal lobe with stable surrounding edema. Vascular: Major vessel flow voids at the skull base are preserved. Skull and upper cervical spine: Normal marrow signal is preserved. Sinuses/Orbits: Mild mucosal thickening.  Orbits are unremarkable. Other: Sella is unremarkable.  Mastoid air cells are clear. IMPRESSION: Expected postoperative changes of gross total resection of lateral left frontoparietal mass. Attention on follow-up recommended for enhancement primarily at the superior and posterior margins. Electronically Signed   By: Macy Mis M.D.   On: 08/12/2020 18:24    Assessment/Plan: Postop day #2: The patient is doing great.  His postoperative MRI looks good with cultures sterile resection of the tumor.  We are awaiting pathology.  I will transfer him to the floor.  I have answered all their questions.  LOS: 10 days     Ophelia Charter 08/13/2020, 6:27 AM

## 2020-08-13 NOTE — TOC Initial Note (Signed)
Transition of Care [facility]) - Initial/Assessment Note    Patient Details  Name: Bryan Hobbs MRN: [id number] Date of Birth: 13-Oct-1950  [facility] CM/SW Contact:    Pollie Friar, RN Phone Number: 08/13/2020, 4:21 PM  Clinical Narrative:                 CM met with the patient and the plans if or he and his significant other to return to [place]. CM inquired about outpatient therapy and he was interested in having the orders so he can f/u in [place]. CM provided him with the orders for outpatient therapy.  Pt also has been using a rolling walker. CM placed order with [employer] and they will deliver the DME to the room. Pt has needed supervision.  TOC following.  Expected Discharge Plan: OP Rehab Barriers to Discharge: Continued Medical Work up   Patient Goals and CMS Choice     Choice offered to / list presented to : Patient  Expected Discharge Plan and Services Expected Discharge Plan: OP Rehab   Discharge Planning Services: CM Consult   Living arrangements for the past 2 months: Apartment                 DME Arranged: Walker rolling DME Agency: [employer] Date DME Agency Contacted: 08/13/20   Representative spoke with at DME Agency: Freda Munro            Prior Living Arrangements/Services Living arrangements for the past 2 months: Apartment Lives with:: Significant Other Patient language and need for interpreter reviewed:: Yes Do you feel safe going back to the place where you live?: Yes      Need for Family Participation in Patient Care: Yes (Comment) Care giver support system in place?: Yes (comment)   Criminal Activity/Legal Involvement Pertinent to Current Situation/Hospitalization: No - Comment as needed  Activities of Daily Living      Permission Sought/Granted                  Emotional Assessment Appearance:: Appears stated age Attitude/Demeanor/Rapport: Engaged Affect (typically observed): Accepting Orientation: : Oriented to  Self,Oriented to Place,Oriented to  Time,Oriented to Situation   Psych Involvement: No (comment)  Admission diagnosis:  Aphasia [R47.01] Brain mass [G93.89] Brain lesion [G93.9] Brain tumor [facility]) [D49.6] Patient Active Problem List   Diagnosis Date Noted  . Brain tumor ([place]) 08/11/2020  . Brain metastases ([place])   . Aortic atherosclerosis ([place]) 08/07/2020  . Prostate mass   . Aphasia   . Elevated PSA   . Brain lesion 08/02/2020   PCP:  Pcp, No Pharmacy:   [id number] [id number] [id number]: [phone]: [phone]    Social Determinants of Health (SDOH) Interventions    Readmission Risk Interventions No flowsheet data found.

## 2020-08-13 NOTE — Progress Notes (Signed)
SLP Cancellation Note  Patient Details Name: Bryan Hobbs MRN: [id number] DOB: 01-Mar-1951   Cancelled treatment:       Reason Eval/Treat Not Completed: SLP screened. RN, pt and husband report no difficulty with swallowing s/p resection. SLP services signed off on dysphagia treatment on 08/09/20 with pt tolerating regular diet. No new needs identified for swallowing at this time. Notified husband/pt that if any changes arise to notify RN. SLP to continue to follow for speech/language treatment.    Ellwood Dense, [name], [name] Acute Rehabilitation Services Office Number: [phone]  Acie Fredrickson 08/13/2020, 12:30 PM

## 2020-08-14 DIAGNOSIS — I619 Nontraumatic intracerebral hemorrhage, unspecified: Secondary | ICD-10-CM | POA: Diagnosis not present

## 2020-08-14 DIAGNOSIS — M4802 Spinal stenosis, cervical region: Secondary | ICD-10-CM | POA: Diagnosis not present

## 2020-08-14 DIAGNOSIS — G4733 Obstructive sleep apnea (adult) (pediatric): Secondary | ICD-10-CM | POA: Diagnosis not present

## 2020-08-14 DIAGNOSIS — R4701 Aphasia: Secondary | ICD-10-CM | POA: Diagnosis not present

## 2020-08-14 LAB — BASIC METABOLIC PANEL
Anion gap: 9 (ref 5–15)
BUN: 28 mg/dL — ABNORMAL HIGH (ref 8–23)
CO2: 21 mmol/L — ABNORMAL LOW (ref 22–32)
Calcium: 8.2 mg/dL — ABNORMAL LOW (ref 8.9–10.3)
Chloride: 106 mmol/L (ref 98–111)
Creatinine, Ser: 1.34 mg/dL — ABNORMAL HIGH (ref 0.61–1.24)
GFR, Estimated: 57 mL/min — ABNORMAL LOW (ref >60)
Glucose, Bld: 122 mg/dL — ABNORMAL HIGH (ref 70–99)
Potassium: 4.5 mmol/L (ref 3.5–5.1)
Sodium: 136 mmol/L (ref 135–145)

## 2020-08-14 LAB — CBC
HCT: 46.7 % (ref 39.0–52.0)
Hemoglobin: 15.4 g/dL (ref 13.0–17.0)
MCH: 30.4 pg (ref 26.0–34.0)
MCHC: 33 g/dL (ref 30.0–36.0)
MCV: 92.1 fL (ref 80.0–100.0)
Platelets: 174 10*3/uL (ref 150–400)
RBC: 5.07 MIL/uL (ref 4.22–5.81)
RDW: 13 % (ref 11.5–15.5)
WBC: 14 10*3/uL — ABNORMAL HIGH (ref 4.0–10.5)
nRBC: 0 % (ref 0.0–0.2)

## 2020-08-14 MED ORDER — DEXAMETHASONE 2 MG PO TABS: 2.0000 mg | ORAL_TABLET | Freq: Three times a day (TID) | ORAL | 0 refills | Status: AC

## 2020-08-14 MED ORDER — LEVETIRACETAM 500 MG PO TABS: 500.0000 mg | ORAL_TABLET | Freq: Two times a day (BID) | ORAL | 0 refills | Status: AC

## 2020-08-14 NOTE — Progress Notes (Signed)
  NEUROSURGERY PROGRESS NOTE   No issues overnight.  No concerns this am Eager for discharge back to [place]  EXAM:  BP 114/88 (BP Location: Left Arm)   Pulse (!) 48   Temp 97.7 F (36.5 C) (Oral)   Resp 18   Ht 5\' 7"  (1.702 m)   Wt 99.2 kg   SpO2 96%   BMI 34.25 kg/m   Awake, alert, oriented  Speech fluent, appropriate  CN grossly intact  5/5 BUE/BLE  Incision: c/d/i  IMPRESSION/PLAN 70 y.o. male s/p crani for resection of tumor. Doing well. Plan to discharge today back to [place]. Patient already has care set up with [facility] in [place]. Continue keppra, decadron at discharge.

## 2020-08-14 NOTE — Plan of Care (Signed)

## 2020-08-14 NOTE — Plan of Care (Signed)
Adequate for discharge.

## 2020-08-14 NOTE — Progress Notes (Signed)
Physical Therapy Treatment Patient Details Name: Bryan Hobbs MRN: [id number] DOB: 06/08/1950 Today's Date: 08/14/2020    History of Present Illness This 70 y.o. male admitted 4/18 with speech difficulty.  CT showed lesion in the Lt posterior frontal and parietal lobe with acute hemorrhage, and another lesion in the posterior Rt parietal lobe which are likely metastases from unknown origin.  MRI of abdomen showed PIRADS category V lesion which is highly suspicious for CA.  However, it is felt that it is unlikley that this is the primary site and appears that brain mets may be melanoma mets. S/p brain tumor total resection on 4/28. PMH: h/o melanoma, Hepatitis A, HTN, sleep apnea    PT Comments    Patient progressing well with mobility but continues to have R sided LE weakness. Patient requires supervision for ambulation due to mild balance deficits. Encouraged use of cane for support and stability. Continue to recommend OPPT following discharge to maximize functional mobility and balance.     Follow Up Recommendations  Outpatient PT;Supervision - Intermittent     Equipment Recommendations  None recommended by PT    Recommendations for Other Services       Precautions / Restrictions Precautions Precautions: Fall Restrictions Weight Bearing Restrictions: No    Mobility  Bed Mobility               General bed mobility comments: in bathroom upon arrival    Transfers Overall transfer level: Independent Equipment used: None                Ambulation/Gait Ambulation/Gait assistance: Supervision Gait Distance (Feet): 300 Feet Assistive device: None Gait Pattern/deviations: Step-through pattern;Drifts right/left;Wide base of support Gait velocity: reduced   General Gait Details: minor LOB throughout with head turns but able to recover. Supervision for safety. Encouraged use of cane in uneven terrain and busy environment   Stairs Stairs: Yes Stairs assistance:  Supervision Stair Management: One rail Left;Forwards;Alternating pattern Number of Stairs: 2     Wheelchair Mobility    Modified Rankin (Stroke Patients Only)       Balance Overall balance assessment: Mild deficits observed, not formally tested                                          Cognition Arousal/Alertness: Awake/alert Behavior During Therapy: WFL for tasks assessed/performed Overall Cognitive Status: Impaired/Different from baseline Area of Impairment: Memory;Problem solving                     Memory: Decreased short-term memory       Problem Solving: Difficulty sequencing;Requires verbal cues General Comments: Difficulty processing noted with instructions of use of cane. Very motivated to get back to PLOF      Exercises      General Comments        Pertinent Vitals/Pain Pain Assessment: No/denies pain    Home Living                      Prior Function            PT Goals (current goals can now be found in the care plan section) Acute Rehab PT Goals Patient Stated Goal: to catch flight to [place] PT Goal Formulation: With patient/family Time For Goal Achievement: 08/19/20 Potential to Achieve Goals: Good Progress towards PT goals: Progressing toward goals  Frequency    Min 3X/week      PT Plan Current plan remains appropriate    Co-evaluation              AM-PAC PT "6 Clicks" Mobility   Outcome Measure  Help needed turning from your back to your side while in a flat bed without using bedrails?: None Help needed moving from lying on your back to sitting on the side of a flat bed without using bedrails?: None Help needed moving to and from a bed to a chair (including a wheelchair)?: None Help needed standing up from a chair using your arms (e.g., wheelchair or bedside chair)?: None Help needed to walk in hospital room?: A Little Help needed climbing 3-5 steps with a railing? : A Little 6 Click  Score: 22    End of Session   Activity Tolerance: Patient tolerated treatment well Patient left:  (up in room with husband) Nurse Communication: Mobility status PT Visit Diagnosis: Unsteadiness on feet (R26.81);Other abnormalities of gait and mobility (R26.89);Muscle weakness (generalized) (M62.81);Difficulty in walking, not elsewhere classified (R26.2);Other symptoms and signs involving the nervous system (R29.898);Apraxia (R48.2)     Time: 6222-9798 PT Time Calculation (min) (ACUTE ONLY): 36 min  Charges:  $Therapeutic Activity: 23-37 mins                     Nedda Gains A. Gilford Rile PT, DPT Acute Rehabilitation Services Pager [phone] Office [phone]    Linna Hoff 08/14/2020, 12:33 PM

## 2020-08-14 NOTE — Progress Notes (Signed)
Patient D/C to home with spouse, D/C information provided to patient. Patient transported via uber.

## 2020-08-14 NOTE — Plan of Care (Signed)
  Problem: Education: Goal: Knowledge of disease or condition will improve Outcome: Progressing Goal: Knowledge of secondary prevention will improve Outcome: Progressing Goal: Knowledge of patient specific risk factors addressed and post discharge goals established will improve Outcome: Progressing   Problem: Coping: Goal: Will verbalize positive feelings about self Outcome: Progressing Goal: Will identify appropriate support needs Outcome: Progressing   Problem: Health Behavior/Discharge Planning: Goal: Ability to manage health-related needs will improve Outcome: Progressing

## 2020-08-14 NOTE — Progress Notes (Signed)
Patient has eye glasses in possession at time of discharge.

## 2020-08-16 ENCOUNTER — Other Ambulatory Visit: Payer: Self-pay | Admitting: Oncology

## 2020-08-16 NOTE — Progress Notes (Signed)
I calle patient and spoke with his spouse. They understand we are fealing with a primary CNS tumor and that the path will not be ready for several days. Their insurance has approved f/u at [facility] and they are waiting to get an appt.  GM

## 2020-08-17 NOTE — Anesthesia Postprocedure Evaluation (Signed)
Anesthesia Post Note  Patient: Bryan Hobbs  Procedure(s) Performed: MRI WITH ANESTHESIA (N/A )     Patient location during evaluation: PACU Anesthesia Type: General Level of consciousness: awake and alert Pain management: pain level controlled Vital Signs Assessment: post-procedure vital signs reviewed and stable Respiratory status: spontaneous breathing, nonlabored ventilation, respiratory function stable and patient connected to nasal cannula oxygen Cardiovascular status: blood pressure returned to baseline and stable Postop Assessment: no apparent nausea or vomiting Anesthetic complications: no   No complications documented.  Last Vitals:  Vitals:   08/14/20 0328 08/14/20 0817  BP: 114/88 (!) 128/94  Pulse: (!) 48 64  Resp: 18 18  Temp: 36.5 C (!) 36.4 C  SpO2: 96% 92%    Last Pain:  Vitals:   08/14/20 1200  TempSrc:   PainSc: 0-No pain                 Oanh Devivo COKER

## 2020-08-30 ENCOUNTER — Encounter (HOSPITAL_COMMUNITY): Payer: Self-pay

## 2020-08-30 LAB — SURGICAL PATHOLOGY

## 2020-12-16 ENCOUNTER — Encounter (HOSPITAL_COMMUNITY): Payer: Self-pay

## 2021-01-19 ENCOUNTER — Encounter (HOSPITAL_COMMUNITY): Payer: Self-pay

## 2022-06-16 DEATH — deceased
